# Patient Record
Sex: Male | Born: 1970 | Race: Black or African American | Hispanic: No | Marital: Married | Smoking: Former smoker
Health system: Southern US, Community
[De-identification: ages and names within clinical notes are randomized; demographics above are authoritative.]

## PROBLEM LIST (undated history)

## (undated) DIAGNOSIS — T7840XA Allergy, unspecified, initial encounter: Secondary | ICD-10-CM

## (undated) DIAGNOSIS — I10 Essential (primary) hypertension: Secondary | ICD-10-CM

## (undated) DIAGNOSIS — M1990 Unspecified osteoarthritis, unspecified site: Secondary | ICD-10-CM

## (undated) DIAGNOSIS — K219 Gastro-esophageal reflux disease without esophagitis: Secondary | ICD-10-CM

## (undated) DIAGNOSIS — G709 Myoneural disorder, unspecified: Secondary | ICD-10-CM

## (undated) DIAGNOSIS — G4730 Sleep apnea, unspecified: Secondary | ICD-10-CM

## (undated) HISTORY — DX: Unspecified osteoarthritis, unspecified site: M19.90

## (undated) HISTORY — DX: Myoneural disorder, unspecified: G70.9

## (undated) HISTORY — PX: BACK SURGERY: SHX140

## (undated) HISTORY — DX: Gastro-esophageal reflux disease without esophagitis: K21.9

## (undated) HISTORY — DX: Allergy, unspecified, initial encounter: T78.40XA

## (undated) HISTORY — DX: Sleep apnea, unspecified: G47.30

---

## 2005-06-09 HISTORY — PX: BACK SURGERY: SHX140

## 2005-06-27 ENCOUNTER — Encounter: Admission: RE | Admit: 2005-06-27 | Discharge: 2005-06-27 | Payer: Self-pay | Admitting: Orthopedic Surgery

## 2005-07-06 ENCOUNTER — Encounter: Admission: RE | Admit: 2005-07-06 | Discharge: 2005-07-06 | Payer: Self-pay | Admitting: Orthopedic Surgery

## 2006-03-20 ENCOUNTER — Ambulatory Visit (HOSPITAL_COMMUNITY): Admission: RE | Admit: 2006-03-20 | Discharge: 2006-03-21 | Payer: Self-pay | Admitting: Specialist

## 2008-07-18 ENCOUNTER — Ambulatory Visit: Payer: Self-pay | Admitting: Family Medicine

## 2008-07-18 DIAGNOSIS — R519 Headache, unspecified: Secondary | ICD-10-CM | POA: Insufficient documentation

## 2008-07-18 DIAGNOSIS — R51 Headache: Secondary | ICD-10-CM

## 2008-07-18 DIAGNOSIS — I10 Essential (primary) hypertension: Secondary | ICD-10-CM | POA: Insufficient documentation

## 2008-07-18 LAB — CONVERTED CEMR LAB
Basophils Absolute: 0 10*3/uL (ref 0.0–0.1)
CO2: 23 meq/L (ref 19–32)
Chloride: 104 meq/L (ref 96–112)
HDL: 24 mg/dL — ABNORMAL LOW (ref >39)
Hemoglobin: 13.8 g/dL (ref 13.0–17.0)
LDL Cholesterol: 113 mg/dL — ABNORMAL HIGH (ref 0–99)
Lymphocytes Relative: 43 % (ref 12–46)
Neutro Abs: 4.3 10*3/uL (ref 1.7–7.7)
Platelets: 331 10*3/uL (ref 150–400)
RDW: 14.1 % (ref 11.5–15.5)
Sodium: 142 meq/L (ref 135–145)
Total CHOL/HDL Ratio: 7.3

## 2008-08-29 ENCOUNTER — Ambulatory Visit: Payer: Self-pay | Admitting: Family Medicine

## 2010-02-27 ENCOUNTER — Ambulatory Visit: Payer: Self-pay | Admitting: Family Medicine

## 2010-02-27 ENCOUNTER — Ambulatory Visit (HOSPITAL_COMMUNITY): Admission: RE | Admit: 2010-02-27 | Discharge: 2010-02-27 | Payer: Self-pay | Admitting: Family Medicine

## 2010-02-27 DIAGNOSIS — E669 Obesity, unspecified: Secondary | ICD-10-CM

## 2010-02-27 DIAGNOSIS — R5381 Other malaise: Secondary | ICD-10-CM

## 2010-02-27 DIAGNOSIS — R350 Frequency of micturition: Secondary | ICD-10-CM

## 2010-02-27 DIAGNOSIS — R5383 Other fatigue: Secondary | ICD-10-CM

## 2010-02-27 DIAGNOSIS — M79609 Pain in unspecified limb: Secondary | ICD-10-CM | POA: Insufficient documentation

## 2010-02-27 DIAGNOSIS — R05 Cough: Secondary | ICD-10-CM

## 2010-02-28 ENCOUNTER — Encounter: Payer: Self-pay | Admitting: Family Medicine

## 2010-03-01 LAB — CONVERTED CEMR LAB
Basophils Absolute: 0.1 10*3/uL (ref 0.0–0.1)
Basophils Relative: 1 % (ref 0–1)
CO2: 25 meq/L (ref 19–32)
Calcium: 9.4 mg/dL (ref 8.4–10.5)
Eosinophils Relative: 2 % (ref 0–5)
HCT: 42.7 % (ref 39.0–52.0)
HDL: 26 mg/dL — ABNORMAL LOW (ref >39)
Lymphocytes Relative: 42 % (ref 12–46)
Neutro Abs: 4.2 10*3/uL (ref 1.7–7.7)
Platelets: 274 10*3/uL (ref 150–400)
RDW: 13.6 % (ref 11.5–15.5)
Sodium: 140 meq/L (ref 135–145)
TSH: 1.029 microintl units/mL (ref 0.350–4.500)
Total CHOL/HDL Ratio: 7.1
Triglycerides: 409 mg/dL — ABNORMAL HIGH (ref <150)

## 2010-03-02 DIAGNOSIS — J309 Allergic rhinitis, unspecified: Secondary | ICD-10-CM | POA: Insufficient documentation

## 2010-07-07 LAB — CONVERTED CEMR LAB
Glucose, Urine, Semiquant: NEGATIVE
WBC Urine, dipstick: NEGATIVE
pH: 5.5

## 2010-07-09 NOTE — Assessment & Plan Note (Signed)
Summary: office visit   Vital Signs:  Patient profile:   40 year old male Height:      71 inches Weight:      224 pounds BMI:     31.35 O2 Sat:      95 % Pulse rate:   87 / minute Pulse rhythm:   regular Resp:     16 per minute BP sitting:   148 / 105  (left arm) Cuff size:   large  Vitals Entered By: Everitt Amber LPN (February 27, 2010 1:32 PM)  Nutrition Counseling: Patient's BMI is greater than 25 and therefore counseled on weight management options. CC: Patient has been urinating alot and when he goes sometimes there is only a few drops that comes out and when he leaves he feels like he has to go again. Wife wants psa and rectal exam done also. He called her while I was in the room and I spoke with her. Needs something for sinuses also, constantly has to blow his nose    CC:  Patient has been urinating alot and when he goes sometimes there is only a few drops that comes out and when he leaves he feels like he has to go again. Wife wants psa and rectal exam done also. He called her while I was in the room and I spoke with her. Needs something for sinuses also and constantly has to blow his nose .  History of Present Illness: Reports  that the has beenn fairly well. He does have several health concerns however. He reports nocturia over the last several months, sometimes with poor stream, and he notes a recent 25 pound weight loss since he has returned to work He does have DM in his fami;ly. he also reports reduced ability to maintain an erection in recent times, and states that at times hisurinary stream is poor.  Denies recent fever or chills. Denies sinus pressure, nasal congestion , ear pain or sore throat. Denies chest congestion, or cough productive of sputum. Occasional chest pain,denies  palpitations, PND, orthopnea or leg swelling. He has been out of his bP meds for the past 1 month. He quit smoking a few months ago. Denies abdominal pain, nausea, vomitting, diarrhea or  constipation. Denies change in bowel movements . Marland Kitchen Denies  joint pain, swelling, or reduced mobility. Uncontrolled throbbing headaches sometimes with nausea and photphobia, did well when he was on migraine meds.Denies vertigo or seizures. Denies depression, anxiety or insomnia. Denies  rash, lesions, or itch.     Current Medications (verified): 1)  Maxzide-25 37.5-25 Mg Tabs (Triamterene-Hctz) .... One Tab By Mouth Once Daily  Allergies (verified): No Known Drug Allergies  Past History:  Past Medical History: Headaches since childhood, worse in the past 3 months, migraine Hands swollen and weak x 6 years. HTN diagnosed 2009 poor urinary stream and nocturia x 24yrs  Social History: Employed, factory. Married quit nicotine 11/2009 no alcohol use One son  Review of Systems      See HPI General:  Complains of fatigue. Eyes:  Denies blurring, discharge, eye pain, and red eye. ENT:  Complains of sinus pressure; frontal mainly n the Summer. GI:  red stool intermittently x 4months, with hic and crystal light. GU:  Complains of erectile dysfunction, nocturia, urinary frequency, and urinary hesitancy; poor urinary stream, at least 4 episodes of urination at night occuring in the past 2 years, symptoms are only at night reportedly, erctions  not as long as they used to be  but able to have an erection. MS:  left lower ext pain and tingling  from hip to ankle , lateral aspect x 2 weeks. Neuro:  Complains of headaches; bitemporal pounding headachesx 3 yrs, photophobia, sleep offers relief.. Endo:  Complains of excessive urination; denies excessive hunger, excessive thirst, and heat intolerance. Heme:  Denies abnormal bruising and bleeding. Allergy:  Complains of seasonal allergies; Summer.  Physical Exam  General:  Well-developed,well-nourished,in no acute distress; alert,appropriate and cooperative throughout examination HEENT: No facial asymmetry,  EOMI, No sinus tenderness, TM's  Clear, oropharynx  pink and moist.   Chest: Clear to auscultation bilaterally.  CVS: S1, S2, No murmurs, No S3.   Abd: Soft, Nontender.  MS: Adequate ROM spine, hips, shoulders and knees.  Ext: No edema.   CNS: CN 2-12 intact, power tone and sensation normal throughout.   Skin: Intact, no visible lesions or rashes.  Psych: Good eye contact, normal affect.  Memory intact, not anxious or depressed appearing. Rectal; prostate non tender, guaic negative stool   Impression & Recommendations:  Problem # 1:  LEG PAIN, LEFT (ICD-729.5) Assessment Deteriorated  Orders: Depo- Medrol 80mg  (J1040) Ketorolac-Toradol 15mg  (O1308) Admin of Therapeutic Inj  intramuscular or subcutaneous (65784)  Problem # 2:  OBESITY (ICD-278.00) Assessment: Comment Only  Orders: T- Hemoglobin A1C (69629-52841)  Ht: 71 (02/27/2010)   Wt: 224 (02/27/2010)   BMI: 31.35 (02/27/2010) therapeutic lifestyle change discussed and encouraged  Problem # 3:  COUGH (ICD-786.2) Assessment: Comment Only  Orders: CXR- 2view (CXR)  Problem # 4:  URINARY FREQUENCY (ICD-788.41) Assessment: Deteriorated  Orders: Urology Referral (Urology) UA Dipstick W/ Micro (manual) (32440), negative  Problem # 5:  HYPERTENSION (ICD-401.9) Assessment: Deteriorated  The following medications were removed from the medication list:    Maxzide-25 37.5-25 Mg Tabs (Triamterene-hctz) ..... One tab by mouth once daily His updated medication list for this problem includes:    Maxzide-25 37.5-25 Mg Tabs (Triamterene-hctz) .Marland Kitchen... Take 1 tablet by mouth once a day Check your Blood Pressure regularly. If it is above150/95  you should make an appointment.  Orders: EKG w/ Interpretation (93000) T-Basic Metabolic Panel (10272-53664)  BP today: 148/105 Prior BP: 110/70 (08/29/2008)  Labs Reviewed: K+: 3.7 (07/18/2008) Creat: : 0.96 (07/18/2008)   Chol: 174 (07/18/2008)   HDL: 24 (07/18/2008)   LDL: 113 (07/18/2008)   TG: 187  (07/18/2008)  Problem # 6:  HEADACHE (ICD-784.0) Assessment: Deteriorated  The following medications were removed from the medication list:    Ibuprofen 800 Mg Tabs (Ibuprofen) ..... One tab by mouth two times a day as needed for severe headache His updated medication list for this problem includes:    Ibuprofen 800 Mg Tabs (Ibuprofen) .Marland Kitchen... Take 1 tablet by mouth two times a day as needed for severe headache topamax 25mg  twice daily  Problem # 7:  ALLERGIC RHINITIS, SEASONAL (ICD-477.0) Assessment: Comment Only loratidine prescribed  Complete Medication List: 1)  Maxzide-25 37.5-25 Mg Tabs (Triamterene-hctz) .... Take 1 tablet by mouth once a day 2)  Topiramate 25 Mg Tabs (Topiramate) .... Take 1 tablet by mouth two times a day 3)  Ibuprofen 800 Mg Tabs (Ibuprofen) .... Take 1 tablet by mouth two times a day as needed for severe headache 4)  Prednisone (pak) 5 Mg Tabs (Prednisone) .... Use as directed 5)  Claritin 10 Mg Tabs (Loratadine) .... Take 1 tablet by mouth once a day as needed for uncontrolled allergy symptoms  Other Orders: T-Lipid Profile (40347-42595) T-CBC w/Diff (63875-64332) T-TSH [phone]) T-PSA (  [phone]) Tdap => 80yrs IM [phone]) Admin 1st Vaccine (24401) Admin 1st Vaccine [facility]) [phone])  Patient Instructions: 1)  Please schedule a follow-up appointment in 2 months. 2)  It is important that you exercise regularly at least 20 minutes 5 times a week. If you develop chest pain, have severe difficulty breathing, or feel very tired , stop exercising immediately and seek medical attention. 3)  You need to lose weight. Consider a lower calorie diet and regular exercise.  4)  Congrats on quitting smoking! 5)  You need to resume your blood pressure medication and also eat less salt, and increased vegetables and fruit. 6)  Med will be sent in for the left leg pain and youi will also get injections, if no better in 2 weeks , pls call and lv a msg. 7)  No blood was  in your stool when checked today. pls keep an eye on this, and if you see red stool , you need to put some on the card we will giveyou and bring it in . 8)  Meds are sent ni for allergies a;lso. 9)  Check your Blood Pressure regularly. If it is above150/95  you should make an appointment. 10)  BMP prior to visit, ICD-9: 11)  Lipid Panel prior to visit, ICD-9: 12)  TSH prior to visit, ICD-9: 13)  CBC w/ Diff prior to visit, ICD-9: 14)  PSA prior to visit, ICD-9: 15)  HbgA1C prior to visit, ICD-9: 16)  Med sent in for migraine headaches, pl s take as directed 17)  You are being referred to a urologist about your urinary probs. Prescriptions: CLARITIN 10 MG TABS (LORATADINE) Take 1 tablet by mouth once a day as needed for uncontrolled allergy symptoms  #90 x 0   Entered and Authorized by:   Syliva Overman MD   Signed by:   Syliva Overman MD on 02/27/2010   Method used:   Printed then faxed to ...         RxID:   [id number] PREDNISONE (PAK) 5 MG TABS (PREDNISONE) Use as directed  #21 x 0   Entered and Authorized by:   Syliva Overman MD   Signed by:   Syliva Overman MD on 02/27/2010   Method used:   Printed then faxed to ...         RxID:   [id number] IBUPROFEN 800 MG TABS (IBUPROFEN) Take 1 tablet by mouth two times a day as needed for severe headache  #40 x 1   Entered and Authorized by:   Syliva Overman MD   Signed by:   Syliva Overman MD on 02/27/2010   Method used:   Printed then faxed to ...         RxID:   [id number] TOPIRAMATE 25 MG TABS (TOPIRAMATE) Take 1 tablet by mouth two times a day  #180 x 1   Entered and Authorized by:   Syliva Overman MD   Signed by:   Syliva Overman MD on 02/27/2010   Method used:   Printed then faxed to ...         RxID:   [id number] MAXZIDE-25 37.5-25 MG TABS (TRIAMTERENE-HCTZ) Take 1 tablet by mouth once a day  #90 x 1   Entered and Authorized by:   Syliva Overman MD   Signed by:   Syliva Overman MD on  02/27/2010   Method used:   Printed then faxed to ...         RxID:   [id number]  Medication Administration  Injection # 1:    Medication: Depo- Medrol 80mg     Diagnosis: LEG PAIN, LEFT (ICD-729.5)    Route: IM    Site: RUOQ gluteus    Exp Date: 10/2010    Lot #: [id number]    Mfr: Pharmacia    Comments: 80mg  given     Patient tolerated injection without complications    Given by: Everitt Amber LPN (February 27, 2010 3:33 PM)  Injection # 2:    Medication: Ketorolac-Toradol 15mg     Diagnosis: LEG PAIN, LEFT (ICD-729.5)    Route: IM    Site: RUOQ gluteus    Exp Date: 08/2011    Lot #: [id number]     Mfr: [email or web address]    Comments: 60mg  given     Patient tolerated injection without complications    Given by: Everitt Amber LPN (February 27, 2010 3:33 PM)  Orders Added: 1)  CXR- 2view [CXR] 2)  Est. Patient Level IV [45409] 3)  EKG w/ Interpretation [93000] 4)  T-Basic Metabolic Panel [80048-22910] 5)  T-Lipid Profile [80061-22930] 6)  T-CBC w/Diff [81191-47829] 7)  T-TSH [56213-08657] 8)  T-PSA [84696-29528] 9)  T- Hemoglobin A1C [83036-23375] 10)  Urology Referral [Urology] 11)  Depo- Medrol 80mg  [J1040] 12)  Ketorolac-Toradol 15mg  [J1885] 13)  Admin of Therapeutic Inj  intramuscular or subcutaneous [96372] 14)  Tdap => 9yrs IM [90715] 15)  Admin 1st Vaccine [90471] 16)  Admin 1st Vaccine (State) [41324M] 17)  UA Dipstick W/ Micro (manual) [81000]      Tetanus/Td Vaccine    Vaccine Type: Tdap    Site: left deltoid    Mfr: boostrix    Dose: 0.5 ml    Route: IM    Given by: Everitt Amber LPN    Exp. Date: 10/2010    Lot #: [id number]    Laboratory Results   Urine Tests    Routine Urinalysis   Color: yellow Appearance: Clear Glucose: negative   (Normal Range: Negative) Bilirubin: negative   (Normal Range: Negative) Ketone: negative   (Normal Range: Negative) Spec. Gravity: 1.020   (Normal Range: 1.003-1.035) Blood: negative   (Normal Range:  Negative) pH: 5.5   (Normal Range: 5.0-8.0) Protein: negative   (Normal Range: Negative) Urobilinogen: 0.2   (Normal Range: 0-1) Nitrite: negative   (Normal Range: Negative) Leukocyte Esterace: negative   (Normal Range: Negative)

## 2010-10-11 ENCOUNTER — Other Ambulatory Visit: Payer: Self-pay | Admitting: Family Medicine

## 2010-10-11 ENCOUNTER — Other Ambulatory Visit: Payer: Self-pay

## 2010-10-11 MED ORDER — TRIAMTERENE-HCTZ 37.5-25 MG PO TABS: 1.0000 | ORAL_TABLET | Freq: Every day | ORAL | Status: DC

## 2010-10-25 NOTE — Op Note (Signed)
NAMEAMADU, SCHLAGETER               ACCOUNT NO.:  [id number]   MEDICAL RECORD NO.:  [id number]          PATIENT TYPE:  OIB   LOCATION:  2550                         FACILITY:  [facility]   PHYSICIAN:  Kerrin Champagne, M.D.   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   The patient was found to have hypertrophic changes involving the right L5-S1  facette causing lateral recess stenosis between it and a protruding disk at  the L5-S1 level posterolaterally affecting primarily the S1 nerve root.   PROCEDURE:  Right L5-S1 lateral recess decompression, decompression of the  S1 nerve root, right-sided L5-S1 microdiskectomy.   SURGEON:  Kerrin Champagne, M.D.   ASSISTANT:  Maud Deed, [facility].   ANESTHESIA:  General via orotracheal intubation, Dr. Diamantina Monks and Dr.  Jean Rosenthal.   ESTIMATED BLOOD LOSS:  Less than 25 mL.   DRAINS:  None.   COMPLICATIONS:  None.  The patient returned to the PACU in good condition.   HISTORY OF PRESENT ILLNESS:  This patient is a 40 year old male who has a  history of previous disk injury that occurred earlier this year, treated  initially with epidural steroids x3 with significant improvement.  He has  recently had recurrence of pain with standing, ambulation, pain with  sitting, just about any position.  Severe posterior thigh and calf in S1  distribution. Clinical exam shows diminished ankle jerk, weakness in plantar  flexion, positive straight leg raise on the right. Repeat MRI study  demonstrates disk protrusion that is concentric towards the right side with  the S1 nerve root entrapped between disk material and hypertrophic facette  joint on the right side at L5-S1.  The patient was brought to the operating  room to undergo lateral recess decompression with excision of HNP L5-S1  right side.  Intraoperative findings as above.   DESCRIPTION OF PROCEDURE:  After adequate general anesthesia the patient  transferred to the [facility] table knee-chest position.  Standard preoperative  antibiotics.  All pressure points well-padded.  The patient had a standard  prep with DuraPrep solution from the lower dorsal spine to the mid sacral  level.  Draped in the usual manner. Iodine Vi-drape was used.   Spinal needles were placed at the expected L5-S1 level and intraoperative  lateral radiograph demonstrated needles on either side of the disk space  over the posterior aspect of the laminas of L5-S1.  Incision was made  initially about 1-1/2 to 2 inches in length through skin and subcutaneous  layers after infiltration of Marcaine 0.5% with 1:200,000 epinephrine.  Incision carried sharply down to the lumbodorsal fascia and this was incised  on the right side of the expected spinous process of L5 and of S1.  Cobb was  used to elevate the paralumbar muscles on the right side off posterior  aspect of  the lamina of L5 and of S1 and over the lateral aspect of the  spinous process of L5.  Boss McCullough retractor was placed. A Penfield 4  placed within the facette on the right side at the L5-S1 level.  Intraoperative radiograph demonstrated the Penfield 4 within the facette  joint.  A Leksell rongeur then used to remove a small portion the inferior  aspect of the lamina of L5 on the right side.  3 mm Kerrison used to further  debride portion of the small portion of the inferior aspect lamina on the  right side at L5 up to near the insertion of the ligamentum flavum.  The  superficial portion of flavum debrided using 3 mm Kerrisons.  The ligamentum  flavum then resected off the superior aspect of the lamina of S1 medially  and then extended laterally. Foraminotomy performed over the S1 nerve root  on the right  side. The pedicle of S1 identified and medial facette quite  hypertrophic.  This was resected lateral to the area of impingement on the  S1 nerve root along the border of the medial aspect of pedicle of S1 in  order to decompress the lateral recess at the L5-S1 level.  This was done up  superiorly to the insertion of ligamentum flavum was encountered and this  was resected off of the ventral surface of the inferior portion of the L5  lamina.  This completed lateral recess decompression. The S1 nerve root was  easily identified, it was erythematous and swollen, well decompressed.  A  D'Errico retractor then used to carefully retract the S1 nerve root,  palpating the posterior aspect of disk, ventral ridge noted over the  posterior aspect of the S1 vertebral body superiorly, disk protrusion over  the dorsal aspect the S1 disk space from the central to the right side  noted.  The operating room microscope draped and brought in the field.  Initially loupe magnification headlamp was used, but the microscope was used  for the resection of the facette and for the retraction of the S1 nerve root  and identification of the disk space and diskectomy that were then performed  making cruciate incision with a 15 blade scalpel, freeing up the disk space  and material using an Epstein curette and removing using micro pituitary and  pituitaries with teeth, debriding the disk loose degenerative disk material  as much as possible was removed.  Care taken not to damage the endplates.  With this then the posterior aspect of disk was felt to be well decompressed  without any signs of protrusion within the spinal canal.  The S1 nerve root  along the lateral recess and the medial aspect of pedicle of S1, exiting the  S1 foramen without difficulty.  Gelfoam thrombin-soaked was applied to the  posterior aspect of the disk and within the spinal canal ventral to the nerve root for hemostasis purposes.  Bone wax  applied to the bleeding  cancellous bone surfaces.  Excess bone wax removed.  Gelfoam removed.  There  was no active bleeding present.  Soft tissues allowed to fall back into  place. The lumbodorsal fascia approximated with interrupted 0 Vicryl sutures  using a UR6 needle.  Deep subcu layers approximated with interrupted 0  Vicryl sutures with UR6 needle.  Subcu layers approximated with interrupted  2-0 Vicryl sutures and skin closed with a running subcu stitch of 4-0 Vicryl  after application of the subcu suture then Dermabond was used to close  the  skin.  4x4s fixed to the skin with Hypafix tape.  The patient then returned  to the supine position, reactivated, extubated, returned to the recovery  room in satisfactory condition.  All instrument and sponge counts were  correct.      Kerrin Champagne, M.D.  Electronically Signed     JEN/MEDQ  D:  03/20/2006  T:  03/23/2006  Job:  [id number]

## 2010-12-04 ENCOUNTER — Encounter: Payer: Self-pay | Admitting: Family Medicine

## 2010-12-05 ENCOUNTER — Ambulatory Visit: Payer: Self-pay | Admitting: Family Medicine

## 2011-03-10 ENCOUNTER — Other Ambulatory Visit: Payer: Self-pay | Admitting: Family Medicine

## 2011-03-27 ENCOUNTER — Encounter: Payer: Self-pay | Admitting: Family Medicine

## 2011-04-01 ENCOUNTER — Ambulatory Visit (INDEPENDENT_AMBULATORY_CARE_PROVIDER_SITE_OTHER): Payer: 59 | Admitting: Family Medicine

## 2011-04-01 ENCOUNTER — Encounter: Payer: Self-pay | Admitting: Family Medicine

## 2011-04-01 VITALS — BP 122/82 | HR 85 | Resp 16 | Ht 71.0 in | Wt 207.8 lb

## 2011-04-01 DIAGNOSIS — R350 Frequency of micturition: Secondary | ICD-10-CM

## 2011-04-01 DIAGNOSIS — I10 Essential (primary) hypertension: Secondary | ICD-10-CM

## 2011-04-01 DIAGNOSIS — R5383 Other fatigue: Secondary | ICD-10-CM

## 2011-04-01 DIAGNOSIS — E669 Obesity, unspecified: Secondary | ICD-10-CM

## 2011-04-01 DIAGNOSIS — R5381 Other malaise: Secondary | ICD-10-CM

## 2011-04-01 DIAGNOSIS — J301 Allergic rhinitis due to pollen: Secondary | ICD-10-CM

## 2011-04-01 DIAGNOSIS — R7301 Impaired fasting glucose: Secondary | ICD-10-CM

## 2011-04-01 DIAGNOSIS — Z13220 Encounter for screening for lipoid disorders: Secondary | ICD-10-CM

## 2011-04-01 DIAGNOSIS — F17200 Nicotine dependence, unspecified, uncomplicated: Secondary | ICD-10-CM

## 2011-04-01 DIAGNOSIS — Z125 Encounter for screening for malignant neoplasm of prostate: Secondary | ICD-10-CM

## 2011-04-01 MED ORDER — FLUTICASONE PROPIONATE 50 MCG/ACT NA SUSP: NASAL | Status: DC

## 2011-04-01 MED ORDER — AMLODIPINE BESYLATE 5 MG PO TABS: 5.0000 mg | ORAL_TABLET | Freq: Every day | ORAL | Status: DC

## 2011-04-01 MED ORDER — PREDNISONE (PAK) 5 MG PO TABS: 5.0000 mg | ORAL_TABLET | ORAL | Status: DC

## 2011-04-01 NOTE — Patient Instructions (Addendum)
CPE in 6 weeks.  Stop triamterene, and change to amlodipine 5 mg one daily for blood pressure starting tomorrow. Hopefully you will urinate less frequently.  Pls stop sodas, drink mainly water and before 6 in the evening.  Spray for allergies and prednisone sent in.  Fasting labs this week Saturday , nothing for 12 hours  It is important that you exercise regularly at least 30 minutes 5 times a week. If you develop chest pain, have severe difficulty breathing, or feel very tired, stop exercising immediately and seek medical attention  A healthy diet is rich in fruit, vegetables and whole grains. Poultry fish, nuts and beans are a healthy choice for protein rather then red meat. A low sodium diet and drinking 64 ounces of water daily is generally recommended. Oils and sweet should be limited. Carbohydrates especially for those who are diabetic or overweight, should be limited to 3-45 gram per meal. It is important to eat on a regular schedule, at least 3 times daily. Snacks should be primarily fruits, vegetables or nuts. Goal weight is 190 pounds  Please think about quitting smoking.  This is very important for your health.  Consider setting a quit date, then cutting back or switching brands to prepare to stop.  Also think of the money you will save every day by not smoking.  Quick Tips to Quit Smoking: Fix a date i.e. keep a date in mind from when you would not touch a tobacco product to smoke  Keep yourself busy and block your mind with work loads or reading books or watching movies in malls where smoking is not allowed  Vanish off the things which reminds you about smoking for example match box, or your favorite lighter, or the pipe you used for smoking, or your favorite jeans and shirt with which you used to enjoy smoking, or the club where you used to do smoking  Try to avoid certain people places and incidences where and with whom smoking is a common factor to add on  Praise yourself with  some token gifts from the money you saved by stopping smoking  Anti Smoking teams are there to help you. Join their programs  Anti-smoking Gums are there in many medical shops. Try them to quit smoking   Side-effects of Smoking: Disease caused by smoking cigarettes are emphysema, bronchitis, heart failures  Premature death  Cancer is the major side effect of smoking  Heart attacks and strokes are the quick effects of smoking causing sudden death  Some smokers lives end up with limbs amputated  Breathing problem or fast breathing is another side effect of smoking  Due to more intakes of smokes, carbon mono-oxide goes into your brain and other muscles of the body which leads to swelling of the veins and blockage to the air passage to lungs  Carbon monoxide blocks blood vessels which leads to blockage in the flow of blood to different major body organs like heart lungs and thus leads to attacks and deaths  During pregnancy smoking is very harmful and leads to premature birth of the infant, spontaneous abortions, low weight of the infant during birth  Fat depositions to narrow and blocked blood vessels causing heart attacks  In many cases cigarette smoking caused infertility in men

## 2011-04-05 LAB — HEMOGLOBIN A1C: Mean Plasma Glucose: 123 mg/dL — ABNORMAL HIGH (ref <117)

## 2011-04-05 LAB — CBC WITH DIFFERENTIAL/PLATELET
Basophils Relative: 0 % (ref 0–1)
Eosinophils Absolute: 0.2 10*3/uL (ref 0.0–0.7)
Eosinophils Relative: 3 % (ref 0–5)
MCH: 30.8 pg (ref 26.0–34.0)
MCHC: 34 g/dL (ref 30.0–36.0)
MCV: 90.4 fL (ref 78.0–100.0)
Neutrophils Relative %: 55 % (ref 43–77)
Platelets: 294 10*3/uL (ref 150–400)

## 2011-04-05 LAB — PSA: PSA: 0.38 ng/mL (ref <=4.00)

## 2011-04-05 LAB — BASIC METABOLIC PANEL
BUN: 12 mg/dL (ref 6–23)
Chloride: 101 mEq/L (ref 96–112)
Potassium: 4.2 mEq/L (ref 3.5–5.3)
Sodium: 139 mEq/L (ref 135–145)

## 2011-04-05 LAB — LIPID PANEL
Cholesterol: 204 mg/dL — ABNORMAL HIGH (ref 0–200)
HDL: 28 mg/dL — ABNORMAL LOW (ref >39)
Total CHOL/HDL Ratio: 7.3 Ratio
Triglycerides: 298 mg/dL — ABNORMAL HIGH (ref <150)
VLDL: 60 mg/dL — ABNORMAL HIGH (ref 0–40)

## 2011-04-05 LAB — TSH: TSH: 1.197 u[IU]/mL (ref 0.350–4.500)

## 2011-04-14 ENCOUNTER — Telehealth: Payer: Self-pay | Admitting: Family Medicine

## 2011-04-17 NOTE — Telephone Encounter (Signed)
Message left on cell # provided by spouse [phone], stating blood sugar has improved, and cholesterol need to be improved

## 2011-04-29 DIAGNOSIS — F17200 Nicotine dependence, unspecified, uncomplicated: Secondary | ICD-10-CM | POA: Insufficient documentation

## 2011-04-29 NOTE — Progress Notes (Signed)
  Subjective:    Patient ID: Evan Black, male    DOB: 07/31/1970, 40 y.o.   MRN: [id number]  HPI The PT is here for follow up and re-evaluation of chronic medical conditions, medication management and review of any available recent lab and radiology data.  Preventive health is updated, specifically  Cancer screening and Immunization.   Questions or concerns regarding consultations or procedures which the PT has had in the interim are  addressed. The PT denies any adverse reactions to current medications since the last visit.  C/o persistent urinary frequency espescialy at night , reports good stream , denies dysuria.  C/o increased allergy symptoms with nasal congestion , which is clear and dry cough. Still smokes not willing to set quit date , but acknowledges the need to quit      Review of Systems See HPI Denies recent fever or chills.  Denies chest congestion, productive cough or wheezing. Denies chest pains, palpitations and leg swelling Denies abdominal pain, nausea, vomiting,diarrhea or constipation.   Denies dysuria, frequency, hesitancy or incontinence. Denies joint pain, swelling and limitation in mobility. Denies headaches, seizures, numbness, or tingling. Denies depression, anxiety or insomnia. Denies skin break down or rash.        Objective:   Physical Exam Patient alert and oriented and in no cardiopulmonary distress.  HEENT: No facial asymmetry, EOMI, no sinus tenderness,  oropharynx pink and moist.  Neck supple no adenopathy.  Chest: Clear to auscultation bilaterally.Decreased air entry bilaterally  CVS: S1, S2 no murmurs, no S3.  ABD: Soft non tender. Bowel sounds normal.  Ext: No edema  MS: Adequate ROM spine, shoulders, hips and knees.  Skin: Intact, no ulcerations or rash noted.  Psych: Good eye contact, normal affect. Memory intact not anxious or depressed appearing.  CNS: CN 2-12 intact, power, tone and sensation normal  throughout.        Assessment & Plan:

## 2011-04-29 NOTE — Assessment & Plan Note (Signed)
Improved. Pt applauded on succesful weight loss through lifestyle change, and encouraged to continue same. Weight loss goal set for the next several months.  

## 2011-04-29 NOTE — Assessment & Plan Note (Signed)
Continued frequency, will change BP med top see if this hjas an effect, will need urology eval if persists

## 2011-04-29 NOTE — Assessment & Plan Note (Signed)
Cessation counseling done 

## 2011-04-29 NOTE — Assessment & Plan Note (Signed)
Increased and uncontrolled symptoms with dry cough med prescribed

## 2011-04-29 NOTE — Assessment & Plan Note (Signed)
Controlled however experiencing a lot of urinary frequency will change med to see if this has an effect

## 2012-03-08 ENCOUNTER — Ambulatory Visit (INDEPENDENT_AMBULATORY_CARE_PROVIDER_SITE_OTHER): Payer: 59 | Admitting: Family Medicine

## 2012-03-08 ENCOUNTER — Encounter: Payer: Self-pay | Admitting: Family Medicine

## 2012-03-08 VITALS — BP 120/80 | HR 77 | Resp 18 | Ht 71.0 in | Wt 213.1 lb

## 2012-03-08 DIAGNOSIS — I10 Essential (primary) hypertension: Secondary | ICD-10-CM

## 2012-03-08 DIAGNOSIS — F17200 Nicotine dependence, unspecified, uncomplicated: Secondary | ICD-10-CM

## 2012-03-08 DIAGNOSIS — R7309 Other abnormal glucose: Secondary | ICD-10-CM

## 2012-03-08 DIAGNOSIS — E785 Hyperlipidemia, unspecified: Secondary | ICD-10-CM

## 2012-03-08 DIAGNOSIS — Z125 Encounter for screening for malignant neoplasm of prostate: Secondary | ICD-10-CM

## 2012-03-08 DIAGNOSIS — R7303 Prediabetes: Secondary | ICD-10-CM

## 2012-03-08 DIAGNOSIS — E669 Obesity, unspecified: Secondary | ICD-10-CM

## 2012-03-08 DIAGNOSIS — R5381 Other malaise: Secondary | ICD-10-CM

## 2012-03-08 DIAGNOSIS — Z13220 Encounter for screening for lipoid disorders: Secondary | ICD-10-CM

## 2012-03-08 NOTE — Patient Instructions (Addendum)
CPE in December  Blood pressure is good.no med change  Fasting labs end November for December visit, cbc, fasting chem 7, lipid, hepatic, hBA1C, TSH and PSA  You need to stop smoking to reduce your risk of heart and lung disease and cancer   Please work on 6 pound weight loss in the next 2 month

## 2012-03-08 NOTE — Progress Notes (Signed)
  Subjective:    Patient ID: Evan Black, male    DOB: [birth year], 41 y.o.   MRN: [id number]  HPI The PT is here for follow up and re-evaluation of chronic medical conditions, medication management and review of any available recent lab and radiology data.  Preventive health is updated, specifically  Cancer screening and Immunization.   Questions or concerns regarding consultations or procedures which the PT has had in the interim are  addressed. The PT denies any adverse reactions to current medications since the last visit.  There are no new concerns, except that he has gained weight, is worried about further weight gain , espescially  There are no specific complaints       Review of Systems See HPI Denies recent fever or chills. Denies sinus pressure, nasal congestion, ear pain or sore throat. Denies chest congestion, productive cough or wheezing. Denies chest pains, palpitations and leg swelling Denies abdominal pain, nausea, vomiting,diarrhea or constipation.   Denies dysuria, frequency, hesitancy or incontinence. Denies joint pain, swelling and limitation in mobility. Denies headaches, seizures, numbness, or tingling. Denies skin break down or rash.        Objective:   Physical Exam Patient alert and oriented and in no cardiopulmonary distress.  HEENT: No facial asymmetry, EOMI, no sinus tenderness,  oropharynx pink and moist.  Neck supple no adenopathy.  Chest: Clear to auscultation bilaterally.  CVS: S1, S2 no murmurs, no S3.  ABD: Soft non tender. Bowel sounds normal.  Ext: No edema  MS: Adequate ROM spine, shoulders, hips and knees.  Skin: Intact, no ulcerations or rash noted.  Psych: Good eye contact, normal affect. Memory intact not anxious or depressed appearing.  CNS: CN 2-12 intact, power, tone and sensation normal throughout.        Assessment & Plan:

## 2012-03-14 DIAGNOSIS — E785 Hyperlipidemia, unspecified: Secondary | ICD-10-CM | POA: Insufficient documentation

## 2012-03-14 DIAGNOSIS — R7303 Prediabetes: Secondary | ICD-10-CM | POA: Insufficient documentation

## 2012-03-14 NOTE — Assessment & Plan Note (Signed)
Patient educated about the importance of limiting  Carbohydrate intake , the need to commit to daily physical activity for a minimum of 30 minutes , and to commit weight loss. The fact that changes in all these areas will reduce or eliminate all together the development of diabetes is stressed.    

## 2012-03-14 NOTE — Assessment & Plan Note (Signed)
Low fat diet discussed and encouraged. Updated lab prior to next visit

## 2012-03-14 NOTE — Assessment & Plan Note (Signed)
Unchanged. Patient counseled for approximately 5 minutes regarding the health risks of ongoing nicotine use, specifically all types of cancer, heart disease, stroke and respiratory failure. The options available for help with cessation ,the behavioral changes to assist the process, and the option to either gradully reduce usage  Or abruptly stop.is also discussed. Pt is also encouraged to set specific goals in number of cigarettes used daily, as well as to set a quit date.  

## 2012-03-14 NOTE — Assessment & Plan Note (Signed)
Controlled, no change in medication DASH diet and commitment to daily physical activity for a minimum of 30 minutes discussed and encouraged, as a part of hypertension management. The importance of attaining a healthy weight is also discussed.  

## 2012-03-14 NOTE — Assessment & Plan Note (Signed)
Deteriorated. Patient re-educated about  the importance of commitment to a  minimum of 150 minutes of exercise per week. The importance of healthy food choices with portion control discussed. Encouraged to start a food diary, count calories and to consider  joining a support group. Sample diet sheets offered. Goals set by the patient for the next several months.    

## 2012-03-18 ENCOUNTER — Telehealth: Payer: Self-pay | Admitting: Family Medicine

## 2012-03-18 ENCOUNTER — Ambulatory Visit (INDEPENDENT_AMBULATORY_CARE_PROVIDER_SITE_OTHER): Payer: 59 | Admitting: Family Medicine

## 2012-03-18 ENCOUNTER — Encounter: Payer: Self-pay | Admitting: Family Medicine

## 2012-03-18 VITALS — BP 130/80 | HR 70 | Resp 18 | Ht 71.0 in | Wt 216.0 lb

## 2012-03-18 DIAGNOSIS — M549 Dorsalgia, unspecified: Secondary | ICD-10-CM | POA: Insufficient documentation

## 2012-03-18 MED ORDER — HYDROCODONE-ACETAMINOPHEN 5-500 MG PO TABS: 1.0000 | ORAL_TABLET | Freq: Four times a day (QID) | ORAL | Status: DC | PRN

## 2012-03-18 MED ORDER — CYCLOBENZAPRINE HCL 10 MG PO TABS: 10.0000 mg | ORAL_TABLET | Freq: Every evening | ORAL | Status: DC | PRN

## 2012-03-18 MED ORDER — IBUPROFEN 600 MG PO TABS: 600.0000 mg | ORAL_TABLET | Freq: Four times a day (QID) | ORAL | Status: DC | PRN

## 2012-03-18 NOTE — Telephone Encounter (Signed)
He can come now and wait to be seen or be here right at 1 oclock

## 2012-03-18 NOTE — Assessment & Plan Note (Signed)
Acute injury per above. I will treat him conservatively with anti-inflammatories, muscle relaxant at night and hydrocodone for severe pain. He does have history of back surgery prior so he does not improve over the next 4 weeks I would image him. No red flags.

## 2012-03-18 NOTE — Telephone Encounter (Signed)
Please advise 

## 2012-03-18 NOTE — Progress Notes (Signed)
  Subjective:    Patient ID: Evan Black, male    DOB: 11-08-1970, 41 y.o.   MRN: [id number]  HPI Patient presents with acute left-sided back pain and hip pain. He was wrestling with some friends when he he pulled supplement her something pop. He has history of chronic back pain he is status post a remote low back surgery. He has been using ibuprofen and Aleve which have not helped. His pain is mostly after he states for long periods of time or as he is trying to lay back. If he is up walking or moving around his pain is improved. He denies any change in bowel or bladder. Pain radiates from his left lower back to just below his hip.   Review of Systems  GEN- denies fatigue, fever, weight loss,weakness, recent illness HEENT- denies eye drainage, change in vision, nasal discharge, CVS- denies chest pain, palpitations RESP- denies SOB, cough, wheeze ABD- denies N/V, change in stools, abd pain GU- denies dysuria, hematuria, dribbling, incontinence MSK- + joint pain, muscle aches, injury       Objective:   Physical Exam GEN- NAD, alert and oriented x3 Neck- Supple normal ROM BACK- TTP left lumbar spine, mild spasm in paraspinals, neg SLR Hip- normal IR/ER , ROM Neuro- able to squat, walk on toes, DTR symmetric bilat lower ext, strength equal bilat, sensation grossly in tact, antalgic gait EXT- No edema Pulses- Radial, DP- 2+        Assessment & Plan:

## 2012-03-18 NOTE — Telephone Encounter (Signed)
Noted and patient in for visit.

## 2012-03-18 NOTE — Patient Instructions (Signed)
Take medications as prescribed Muscle relaxant, anti-inflammatory Pain medication- Vicodin, do not take while workingBack Pain, Adult Back pain is very common. The pain often gets better over time. The cause of back pain is usually not dangerous. Most people can learn to manage their back pain on their own.   HOME CARE    Stay active. Start with short walks on flat ground if you can. Try to walk farther each day.   Do not sit, drive, or stand in one place for more than 30 minutes. Do not stay in bed.   Do not avoid exercise or work. Activity can help your back heal faster.   Be careful when you bend or lift an object. Bend at your knees, keep the object close to you, and do not twist.   Sleep on a firm mattress. Lie on your side, and bend your knees. If you lie on your back, put a pillow under your knees.   Only take medicines as told by your doctor.   Put ice on the injured area.   Put ice in a plastic bag.   Place a towel between your skin and the bag.   Leave the ice on for 15 to 20 minutes, 3 to 4 times a day for the first 2 to 3 days. After that, you can switch between ice and heat packs.   Ask your doctor about back exercises or massage.   Avoid feeling anxious or stressed. Find good ways to deal with stress, such as exercise.  GET HELP RIGHT AWAY IF:    Your pain does not go away with rest or medicine.   Your pain does not go away in 1 week.   You have new problems.   You do not feel well.   The pain spreads into your legs.   You cannot control when you poop (bowel movement) or pee (urinate).   Your arms or legs feel weak or lose feeling (numbness).   You feel sick to your stomach (nauseous) or throw up (vomit).   You have belly (abdominal) pain.   You feel like you may pass out (faint).  MAKE SURE YOU:    Understand these instructions.   Will watch your condition.   Will get help right away if you are not doing well or get worse.  Document Released:  11/12/2007 Document Revised: 08/18/2011 Document Reviewed: 10/14/2010 [facility] Patient Information 2013 [place], [place].

## 2012-06-08 ENCOUNTER — Encounter: Payer: 59 | Admitting: Family Medicine

## 2012-08-16 ENCOUNTER — Encounter (HOSPITAL_COMMUNITY): Payer: Self-pay | Admitting: *Deleted

## 2012-08-16 ENCOUNTER — Emergency Department (HOSPITAL_COMMUNITY)
Admission: EM | Admit: 2012-08-16 | Discharge: 2012-08-17 | Disposition: A | Discharge status: Home / Self Care | Payer: 59 | Attending: Emergency Medicine | Admitting: Emergency Medicine

## 2012-08-16 ENCOUNTER — Emergency Department (HOSPITAL_COMMUNITY): Payer: 59

## 2012-08-16 DIAGNOSIS — F17200 Nicotine dependence, unspecified, uncomplicated: Secondary | ICD-10-CM | POA: Insufficient documentation

## 2012-08-16 DIAGNOSIS — M79604 Pain in right leg: Secondary | ICD-10-CM

## 2012-08-16 DIAGNOSIS — M79609 Pain in unspecified limb: Secondary | ICD-10-CM | POA: Insufficient documentation

## 2012-08-16 MED ORDER — DIPHENHYDRAMINE HCL 50 MG/ML IJ SOLN
25.0000 mg | Freq: Once | INTRAMUSCULAR | Status: AC
  Administered 2012-08-16: 25 mg via INTRAVENOUS
  Filled 2012-08-16: qty 1

## 2012-08-16 MED ORDER — SODIUM CHLORIDE 0.9 % IV BOLUS (SEPSIS)
1000.0000 mL | Freq: Once | INTRAVENOUS | Status: AC
  Administered 2012-08-16: 1000 mL via INTRAVENOUS

## 2012-08-16 MED ORDER — KETOROLAC TROMETHAMINE 30 MG/ML IJ SOLN
30.0000 mg | Freq: Once | INTRAMUSCULAR | Status: AC
  Administered 2012-08-16: 30 mg via INTRAVENOUS
  Filled 2012-08-16: qty 1

## 2012-08-16 MED ORDER — HYDROMORPHONE HCL PF 1 MG/ML IJ SOLN
1.0000 mg | Freq: Once | INTRAMUSCULAR | Status: AC
  Administered 2012-08-16: 1 mg via INTRAVENOUS
  Filled 2012-08-16: qty 1

## 2012-08-16 MED ORDER — SODIUM CHLORIDE 0.9 % IV SOLN: Freq: Once | INTRAVENOUS | Status: DC

## 2012-08-16 MED ORDER — CYCLOBENZAPRINE HCL 10 MG PO TABS
10.0000 mg | ORAL_TABLET | Freq: Once | ORAL | Status: AC
  Administered 2012-08-16: 10 mg via ORAL
  Filled 2012-08-16: qty 1

## 2012-08-16 NOTE — ED Provider Notes (Signed)
History     CSN: [id number]  Arrival date & time 08/16/12  2206   First MD Initiated Contact with Patient 08/16/12 2304      Chief Complaint  Patient presents with  . Leg Pain    (Consider location/radiation/quality/duration/timing/severity/associated sxs/prior treatment) HPI Evan Black is a 42 y.o. male who presents to the Emergency Department complaining of leg pain for 3 days from the left hip down the leg to the foot. The pain is severe and has not responded to hydrocodone and flexeril. He does not recall having hurt himself or done any strenuous activity.  History reviewed. No pertinent past medical history.  Past Surgical History  Procedure Laterality Date  . Back surgery      History reviewed. No pertinent family history.  History  Substance Use Topics  . Smoking status: Current Every Day Smoker    Types: Cigarettes  . Smokeless tobacco: Not on file  . Alcohol Use: Yes     Comment: occ      Review of Systems  Musculoskeletal:       Left hip pain and left leg pain    Allergies  Review of patient's allergies indicates no known allergies.  Home Medications   Current Outpatient Rx  Name  Route  Sig  Dispense  Refill  . cyclobenzaprine (FLEXERIL) 10 MG tablet   Oral   Take 10 mg by mouth once as needed for muscle spasms.         Marland Kitchen HYDROcodone-acetaminophen (VICODIN) 5-500 MG per tablet   Oral   Take 1 tablet by mouth once as needed for pain.         Marland Kitchen ibuprofen (ADVIL,MOTRIN) 600 MG tablet   Oral   Take 600 mg by mouth once as needed for pain.           BP 169/110  Pulse 73  Temp(Src) 97.5 F (36.4 C) (Oral)  Resp 18  SpO2 94%  Physical Exam  Nursing note and vitals reviewed. Constitutional: He is oriented to person, place, and time. He appears well-developed and well-nourished.  Awake, alert, nontoxic appearance.  HENT:  Head: Normocephalic and atraumatic.  Right Ear: External ear normal.  Left Ear: External ear normal.  Eyes:  EOM are normal. Pupils are equal, round, and reactive to light. Right eye exhibits no discharge. Left eye exhibits no discharge.  Neck: Neck supple.  Cardiovascular: Normal rate and intact distal pulses.   Pulmonary/Chest: Effort normal and breath sounds normal. He exhibits no tenderness.  Abdominal: Soft. Bowel sounds are normal. There is no tenderness. There is no rebound.  Musculoskeletal: He exhibits no tenderness.  Baseline ROM, no obvious new focal weakness.No focal weakness, no lesions, no swelling, movement of ankle, knee, hip on the left side.   Neurological: He is alert and oriented to person, place, and time.  Mental status and motor strength appears baseline for patient and situation.  Skin: No rash noted.  Psychiatric: He has a normal mood and affect.    ED Course  Procedures (including critical care time)  Dg Lumbar Spine Complete  08/17/2012  *RADIOLOGY REPORT*  Clinical Data: Low back pain  LUMBAR SPINE - COMPLETE 4+ VIEW  Comparison: 07/06/2005 MRI  Findings: L4-5 and L5-S1 degenerative changes, with disc height loss, more pronounced at L5-S1.  No acute fracture or dislocation. No aggressive osseous lesion.  Overlying soft tissues unremarkable.  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  No acute osseous finding.   Original Report Authenticated  By: Jearld Lesch, M.D.      MDM  Patient with continuing leg pain x 3 days. Given analgesic and zofran with relief. Xrays do not show anything but DJD. Reviewed results with patient. Pt stable in ED with no significant deterioration in condition.The patient appears reasonably screened and/or stabilized for discharge and I doubt any other medical condition or other [facility] requiring further screening, evaluation, or treatment in the ED at this time prior to discharge.MDM Reviewed: nursing note and vitals Interpretation: x-ray           Nicoletta Dress. Colon Branch, MD 08/17/12 1610

## 2012-08-16 NOTE — ED Notes (Signed)
Left leg pain for 3 days, similar problem in past related to " a nerve" no known injury,

## 2012-08-17 ENCOUNTER — Other Ambulatory Visit: Payer: Self-pay

## 2012-08-17 DIAGNOSIS — I10 Essential (primary) hypertension: Secondary | ICD-10-CM

## 2012-08-17 MED ORDER — HYDROCODONE-ACETAMINOPHEN 5-325 MG PO TABS
1.0000 | ORAL_TABLET | ORAL | Status: DC | PRN
Start: 2012-08-17 — End: 2012-08-17

## 2012-08-17 MED ORDER — ONDANSETRON 4 MG PO TBDP: 4.0000 mg | ORAL_TABLET | Freq: Three times a day (TID) | ORAL | Status: DC | PRN

## 2012-08-17 MED ORDER — AMLODIPINE BESYLATE 5 MG PO TABS: 5.0000 mg | ORAL_TABLET | Freq: Every day | ORAL | Status: DC

## 2012-08-17 MED ORDER — HYDROCODONE-ACETAMINOPHEN 5-325 MG PO TABS: 1.0000 | ORAL_TABLET | ORAL | Status: DC | PRN

## 2012-08-17 NOTE — ED Provider Notes (Signed)
Patient called because he did not want the ondansetron prescription filled but the pharmacy would not fill the Norco prescription and without the ondansetron prescription because it was noted on the same sheet of paper. I reviewed his record on [place] controlled substance reporting website and he has only one other narcotic prescription in the last 6 months she does not appear to be a narcotic abuser. Prescription for Norco was reported without prescription for the ondansetron.  [name], MD 08/17/12 [phone]

## 2012-08-17 NOTE — ED Notes (Signed)
Discharge instructions reviewed with pt, questions answered. Pt verbalized understanding.  

## 2012-08-19 ENCOUNTER — Encounter: Payer: Self-pay | Admitting: Family Medicine

## 2012-08-19 ENCOUNTER — Ambulatory Visit: Payer: 59 | Admitting: Family Medicine

## 2013-01-07 ENCOUNTER — Emergency Department (HOSPITAL_COMMUNITY): Payer: BC Managed Care – PPO

## 2013-01-07 ENCOUNTER — Encounter (HOSPITAL_COMMUNITY): Payer: Self-pay | Admitting: *Deleted

## 2013-01-07 ENCOUNTER — Emergency Department (HOSPITAL_COMMUNITY)
Admission: EM | Admit: 2013-01-07 | Discharge: 2013-01-07 | Disposition: A | Discharge status: Home / Self Care | Payer: BC Managed Care – PPO | Attending: Emergency Medicine | Admitting: Emergency Medicine

## 2013-01-07 DIAGNOSIS — Y9389 Activity, other specified: Secondary | ICD-10-CM | POA: Insufficient documentation

## 2013-01-07 DIAGNOSIS — S8990XA Unspecified injury of unspecified lower leg, initial encounter: Secondary | ICD-10-CM | POA: Insufficient documentation

## 2013-01-07 DIAGNOSIS — S0003XA Contusion of scalp, initial encounter: Secondary | ICD-10-CM | POA: Insufficient documentation

## 2013-01-07 DIAGNOSIS — Z79899 Other long term (current) drug therapy: Secondary | ICD-10-CM | POA: Insufficient documentation

## 2013-01-07 DIAGNOSIS — S0993XA Unspecified injury of face, initial encounter: Secondary | ICD-10-CM | POA: Insufficient documentation

## 2013-01-07 DIAGNOSIS — Y92410 Unspecified street and highway as the place of occurrence of the external cause: Secondary | ICD-10-CM | POA: Insufficient documentation

## 2013-01-07 DIAGNOSIS — Z87891 Personal history of nicotine dependence: Secondary | ICD-10-CM | POA: Insufficient documentation

## 2013-01-07 DIAGNOSIS — S1093XA Contusion of unspecified part of neck, initial encounter: Secondary | ICD-10-CM | POA: Insufficient documentation

## 2013-01-07 DIAGNOSIS — S59909A Unspecified injury of unspecified elbow, initial encounter: Secondary | ICD-10-CM | POA: Insufficient documentation

## 2013-01-07 DIAGNOSIS — M2550 Pain in unspecified joint: Secondary | ICD-10-CM

## 2013-01-07 DIAGNOSIS — S199XXA Unspecified injury of neck, initial encounter: Secondary | ICD-10-CM | POA: Insufficient documentation

## 2013-01-07 DIAGNOSIS — S069X9A Unspecified intracranial injury with loss of consciousness of unspecified duration, initial encounter: Secondary | ICD-10-CM

## 2013-01-07 DIAGNOSIS — S6990XA Unspecified injury of unspecified wrist, hand and finger(s), initial encounter: Secondary | ICD-10-CM | POA: Insufficient documentation

## 2013-01-07 DIAGNOSIS — S060X9A Concussion with loss of consciousness of unspecified duration, initial encounter: Secondary | ICD-10-CM | POA: Insufficient documentation

## 2013-01-07 DIAGNOSIS — I10 Essential (primary) hypertension: Secondary | ICD-10-CM | POA: Insufficient documentation

## 2013-01-07 HISTORY — DX: Essential (primary) hypertension: I10

## 2013-01-07 MED ORDER — IBUPROFEN 600 MG PO TABS: 600.0000 mg | ORAL_TABLET | Freq: Four times a day (QID) | ORAL | Status: DC | PRN

## 2013-01-07 MED ORDER — HYDROCODONE-ACETAMINOPHEN 5-325 MG PO TABS: 1.0000 | ORAL_TABLET | ORAL | Status: DC | PRN

## 2013-01-07 MED ORDER — HYDROCODONE-ACETAMINOPHEN 5-325 MG PO TABS
1.0000 | ORAL_TABLET | Freq: Once | ORAL | Status: AC
  Administered 2013-01-07: 1 via ORAL
  Filled 2013-01-07: qty 1
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
## 2013-01-10 ENCOUNTER — Ambulatory Visit (INDEPENDENT_AMBULATORY_CARE_PROVIDER_SITE_OTHER): Payer: BC Managed Care – PPO | Admitting: Family Medicine

## 2013-01-10 ENCOUNTER — Encounter: Payer: Self-pay | Admitting: Family Medicine

## 2013-01-10 DIAGNOSIS — Z125 Encounter for screening for malignant neoplasm of prostate: Secondary | ICD-10-CM

## 2013-01-10 DIAGNOSIS — E785 Hyperlipidemia, unspecified: Secondary | ICD-10-CM

## 2013-01-10 DIAGNOSIS — E669 Obesity, unspecified: Secondary | ICD-10-CM

## 2013-01-10 DIAGNOSIS — R7309 Other abnormal glucose: Secondary | ICD-10-CM

## 2013-01-10 DIAGNOSIS — R7303 Prediabetes: Secondary | ICD-10-CM

## 2013-01-10 DIAGNOSIS — I10 Essential (primary) hypertension: Secondary | ICD-10-CM

## 2013-01-10 MED ORDER — AMLODIPINE BESYLATE 5 MG PO TABS: 5.0000 mg | ORAL_TABLET | Freq: Every day | ORAL | Status: DC

## 2013-01-10 MED ORDER — CYCLOBENZAPRINE HCL 10 MG PO TABS: 10.0000 mg | ORAL_TABLET | Freq: Three times a day (TID) | ORAL | Status: AC | PRN
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
## 2013-01-11 LAB — CBC
HCT: 40.8 % (ref 39.0–52.0)
Hemoglobin: 13.7 g/dL (ref 13.0–17.0)
MCV: 86.1 fL (ref 78.0–100.0)
RBC: 4.74 MIL/uL (ref 4.22–5.81)
WBC: 7.5 10*3/uL (ref 4.0–10.5)

## 2013-01-12 LAB — BASIC METABOLIC PANEL
BUN: 11 mg/dL (ref 6–23)
Chloride: 102 mEq/L (ref 96–112)
Potassium: 4.3 mEq/L (ref 3.5–5.3)
Sodium: 140 mEq/L (ref 135–145)

## 2013-01-12 LAB — LIPID PANEL
Cholesterol: 184 mg/dL (ref 0–200)
Total CHOL/HDL Ratio: 5.9 Ratio

## 2013-01-12 LAB — HEMOGLOBIN A1C
Hgb A1c MFr Bld: 5.7 % — ABNORMAL HIGH (ref <5.7)
Mean Plasma Glucose: 117 mg/dL — ABNORMAL HIGH (ref <117)

## 2013-01-13 NOTE — ED Provider Notes (Signed)
Medical screening examination/treatment/procedure(s) were performed by non-physician practitioner and as supervising physician I was immediately available for consultation/collaboration.  Raeford Razor, MD 01/13/13 [phone]

## 2013-01-18 NOTE — Assessment & Plan Note (Signed)
Deteriorated. Patient re-educated about  the importance of commitment to a  minimum of 150 minutes of exercise per week. The importance of healthy food choices with portion control discussed. Encouraged to start a food diary, count calories and to consider  joining a support group. Sample diet sheets offered. Goals set by the patient for the next several months.    

## 2013-01-18 NOTE — Assessment & Plan Note (Signed)
Uncontrolled, needs to resume meds

## 2013-01-18 NOTE — Assessment & Plan Note (Signed)
Soft tissue injury on exam, with muscle spasm and generalized pain, ortho to follow  Muscle relaxant added

## 2013-01-19 ENCOUNTER — Ambulatory Visit (HOSPITAL_COMMUNITY)
Admission: RE | Admit: 2013-01-19 | Discharge: 2013-01-19 | Disposition: A | Discharge status: Home / Self Care | Payer: BC Managed Care – PPO | Source: Ambulatory Visit | Attending: Family Medicine | Admitting: Family Medicine

## 2013-01-19 DIAGNOSIS — I10 Essential (primary) hypertension: Secondary | ICD-10-CM | POA: Insufficient documentation

## 2013-01-19 DIAGNOSIS — IMO0001 Reserved for inherently not codable concepts without codable children: Secondary | ICD-10-CM | POA: Insufficient documentation

## 2013-01-19 DIAGNOSIS — M542 Cervicalgia: Secondary | ICD-10-CM | POA: Insufficient documentation
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
## 2013-01-20 ENCOUNTER — Telehealth: Payer: Self-pay | Admitting: Family Medicine

## 2013-01-20 ENCOUNTER — Ambulatory Visit (INDEPENDENT_AMBULATORY_CARE_PROVIDER_SITE_OTHER): Payer: BC Managed Care – PPO | Admitting: Family Medicine

## 2013-01-20 ENCOUNTER — Encounter: Payer: Self-pay | Admitting: Family Medicine

## 2013-01-20 ENCOUNTER — Ambulatory Visit (HOSPITAL_COMMUNITY)
Admission: RE | Admit: 2013-01-20 | Discharge: 2013-01-20 | Disposition: A | Discharge status: Home / Self Care | Payer: BC Managed Care – PPO | Source: Ambulatory Visit | Attending: Family Medicine | Admitting: Family Medicine

## 2013-01-20 VITALS — BP 134/80 | HR 76 | Resp 18 | Ht 71.0 in | Wt 222.0 lb

## 2013-01-20 DIAGNOSIS — M7989 Other specified soft tissue disorders: Secondary | ICD-10-CM | POA: Insufficient documentation

## 2013-01-20 DIAGNOSIS — I10 Essential (primary) hypertension: Secondary | ICD-10-CM

## 2013-01-20 DIAGNOSIS — M79609 Pain in unspecified limb: Secondary | ICD-10-CM | POA: Insufficient documentation

## 2013-01-20 NOTE — Patient Instructions (Addendum)
F/u as before  You are referred for an ultrasound of the right leg to ensure you have no clot in the leg

## 2013-01-20 NOTE — Telephone Encounter (Signed)
Noted that patient has appointment scheduled for this afternoon.

## 2013-01-20 NOTE — Telephone Encounter (Signed)
Aware of this also will keep calling patient
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
## 2013-01-21 ENCOUNTER — Telehealth: Payer: Self-pay | Admitting: Family Medicine

## 2013-01-21 NOTE — Telephone Encounter (Signed)
Dr.Simpson has spoke with patient

## 2013-01-21 NOTE — Telephone Encounter (Signed)
I spoke with wife regarding ultrasound findings, and later with the patient himself. I reviewed the report wioth the radiologist and that discussion is documented in another note

## 2013-01-26 ENCOUNTER — Ambulatory Visit (HOSPITAL_COMMUNITY): Payer: BC Managed Care – PPO | Admitting: *Deleted
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
## 2013-01-30 NOTE — Assessment & Plan Note (Signed)
Controlled, no change in medication  

## 2013-01-31 ENCOUNTER — Ambulatory Visit (HOSPITAL_COMMUNITY)
Admission: RE | Admit: 2013-01-31 | Discharge: 2013-01-31 | Disposition: A | Discharge status: Home / Self Care | Payer: BC Managed Care – PPO | Source: Ambulatory Visit | Attending: Physical Therapy | Admitting: Physical Therapy

## 2013-01-31 DIAGNOSIS — M542 Cervicalgia: Secondary | ICD-10-CM
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
## 2013-02-02 ENCOUNTER — Ambulatory Visit (HOSPITAL_COMMUNITY)
Admission: RE | Admit: 2013-02-02 | Discharge: 2013-02-02 | Disposition: A | Discharge status: Home / Self Care | Payer: BC Managed Care – PPO | Source: Ambulatory Visit | Attending: Physical Therapy | Admitting: Physical Therapy
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
## 2013-02-08 ENCOUNTER — Telehealth (HOSPITAL_COMMUNITY): Payer: Self-pay

## 2013-02-08 ENCOUNTER — Inpatient Hospital Stay (HOSPITAL_COMMUNITY): Admission: RE | Admit: 2013-02-08 | Payer: BC Managed Care – PPO | Source: Ambulatory Visit

## 2013-02-10 ENCOUNTER — Ambulatory Visit (HOSPITAL_COMMUNITY)
Admission: RE | Admit: 2013-02-10 | Discharge: 2013-02-10 | Disposition: A | Discharge status: Home / Self Care | Payer: BC Managed Care – PPO | Source: Ambulatory Visit | Attending: Family Medicine | Admitting: Family Medicine

## 2013-02-10 DIAGNOSIS — IMO0001 Reserved for inherently not codable concepts without codable children: Secondary | ICD-10-CM | POA: Insufficient documentation

## 2013-02-10 DIAGNOSIS — I10 Essential (primary) hypertension: Secondary | ICD-10-CM | POA: Insufficient documentation

## 2013-02-10 DIAGNOSIS — M542 Cervicalgia: Secondary | ICD-10-CM | POA: Insufficient documentation
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
## 2013-02-14 ENCOUNTER — Ambulatory Visit (HOSPITAL_COMMUNITY)
Admission: RE | Admit: 2013-02-14 | Discharge: 2013-02-14 | Disposition: A | Discharge status: Home / Self Care | Payer: BC Managed Care – PPO | Source: Ambulatory Visit | Attending: Family Medicine | Admitting: Family Medicine
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
## 2013-02-16 ENCOUNTER — Ambulatory Visit (HOSPITAL_COMMUNITY)
Admission: RE | Admit: 2013-02-16 | Discharge: 2013-02-16 | Disposition: A | Discharge status: Home / Self Care | Payer: BC Managed Care – PPO | Source: Ambulatory Visit

## 2013-02-16 DIAGNOSIS — M542 Cervicalgia: Secondary | ICD-10-CM
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
## 2013-03-01 ENCOUNTER — Ambulatory Visit (HOSPITAL_COMMUNITY)
Admission: RE | Admit: 2013-03-01 | Discharge: 2013-03-01 | Disposition: A | Discharge status: Home / Self Care | Payer: BC Managed Care – PPO | Source: Ambulatory Visit | Attending: Family Medicine | Admitting: Family Medicine

## 2013-03-01 DIAGNOSIS — M542 Cervicalgia: Secondary | ICD-10-CM
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
## 2013-04-18 ENCOUNTER — Encounter: Payer: Self-pay | Admitting: Family Medicine

## 2013-04-18 ENCOUNTER — Ambulatory Visit (INDEPENDENT_AMBULATORY_CARE_PROVIDER_SITE_OTHER): Payer: BC Managed Care – PPO | Admitting: Family Medicine

## 2013-04-18 ENCOUNTER — Encounter (INDEPENDENT_AMBULATORY_CARE_PROVIDER_SITE_OTHER): Payer: Self-pay

## 2013-04-18 VITALS — BP 132/82 | HR 74 | Resp 18 | Ht 71.0 in | Wt 210.1 lb

## 2013-04-18 DIAGNOSIS — E785 Hyperlipidemia, unspecified: Secondary | ICD-10-CM

## 2013-04-18 DIAGNOSIS — R7309 Other abnormal glucose: Secondary | ICD-10-CM

## 2013-04-18 DIAGNOSIS — I10 Essential (primary) hypertension: Secondary | ICD-10-CM

## 2013-04-18 DIAGNOSIS — E669 Obesity, unspecified: Secondary | ICD-10-CM

## 2013-04-18 DIAGNOSIS — R7303 Prediabetes: Secondary | ICD-10-CM

## 2013-04-18 DIAGNOSIS — R5381 Other malaise: Secondary | ICD-10-CM

## 2013-04-18 NOTE — Progress Notes (Signed)
  Subjective:    Patient ID: Evan Black, male    DOB: May 31, 1971, 42 y.o.   MRN: [id number]  HPI The PT is here for follow up and re-evaluation of chronic medical conditions, medication management and review of any available recent lab and radiology data.  Preventive health is updated, specifically  Cancer screening and Immunization.   Questions or concerns regarding consultations or procedures which the PT has had in the interim are  addressed. The PT denies any adverse reactions to current medications since the last visit.  There are no new concerns. Doing very well with change in diet and weight loss There are no specific complaints       Review of Systems See HPI Denies recent fever or chills. Denies sinus pressure, nasal congestion, ear pain or sore throat. Denies chest congestion, productive cough or wheezing. Denies chest pains, palpitations and leg swelling Denies abdominal pain, nausea, vomiting,diarrhea or constipation.   Denies dysuria, frequency, hesitancy or incontinence. Denies joint pain, swelling and limitation in mobility. Denies headaches, seizures, numbness, or tingling. Denies depression, anxiety or insomnia. Denies skin break down or rash.        Objective:   Physical Exam  Patient alert and oriented and in no cardiopulmonary distress.  HEENT: No facial asymmetry, EOMI, no sinus tenderness,  oropharynx pink and moist.  Neck supple no adenopathy.  Chest: Clear to auscultation bilaterally.  CVS: S1, S2 no murmurs, no S3.  ABD: Soft non tender. Bowel sounds normal.  Ext: No edema  MS: Adequate ROM spine, shoulders, hips and knees.  Skin: Intact, no ulcerations or rash noted.  Psych: Good eye contact, normal affect. Memory intact not anxious or depressed appearing.  CNS: CN 2-12 intact, power, tone and sensation normal throughout.       Assessment & Plan:

## 2013-04-18 NOTE — Patient Instructions (Signed)
F/u in 4.5 month  Fasting HBA1C, and chem7 and and lipid in 4.5 month  CONGRATS , keep it up, and commit to exercise at least 3 to 4 days per week for 30 minutes

## 2013-05-29 NOTE — Assessment & Plan Note (Signed)
Hyperlipidemia:Low fat diet discussed and encouraged.  No med management at this time

## 2013-05-29 NOTE — Assessment & Plan Note (Signed)
Improved. Pt applauded on succesful weight loss through lifestyle change, and encouraged to continue same. Weight loss goal set for the next several months.  

## 2013-05-29 NOTE — Assessment & Plan Note (Signed)
Controlled, no change in medication DASH diet and commitment to daily physical activity for a minimum of 30 minutes discussed and encouraged, as a part of hypertension management. The importance of attaining a healthy weight is also discussed.  

## 2013-05-29 NOTE — Assessment & Plan Note (Signed)
Improved HBA1C but not yet at goal Patient educated about the importance of limiting  Carbohydrate intake , the need to commit to daily physical activity for a minimum of 30 minutes , and to commit weight loss. The fact that changes in all these areas will reduce or eliminate all together the development of diabetes is stressed.

## 2013-05-31 ENCOUNTER — Encounter: Payer: Self-pay | Admitting: Cardiology

## 2013-05-31 ENCOUNTER — Ambulatory Visit (HOSPITAL_COMMUNITY): Payer: BC Managed Care – PPO | Attending: Orthopedic Surgery

## 2013-05-31 DIAGNOSIS — M79609 Pain in unspecified limb: Secondary | ICD-10-CM

## 2013-05-31 DIAGNOSIS — M7989 Other specified soft tissue disorders: Secondary | ICD-10-CM | POA: Insufficient documentation

## 2013-05-31 DIAGNOSIS — R609 Edema, unspecified: Secondary | ICD-10-CM

## 2013-06-24 ENCOUNTER — Telehealth: Payer: Self-pay

## 2013-06-24 ENCOUNTER — Other Ambulatory Visit: Payer: Self-pay

## 2013-06-24 MED ORDER — OSELTAMIVIR PHOSPHATE 75 MG PO CAPS: 75.0000 mg | ORAL_CAPSULE | Freq: Two times a day (BID) | ORAL | Status: DC

## 2013-06-24 NOTE — Telephone Encounter (Signed)
pls send in tamiflu 75 mg twice daily #10 only, also advise tylenol for fever and pain and a lot of fluids and rest. Cal;l back/go to Ed if symptoms worsen in next 48 hrs

## 2013-06-24 NOTE — Telephone Encounter (Signed)
X 2 days, fever, chills, bodyaches and nasal congestion. Patient sitting in chair under heavy comforter and is still cold. Requesting tamiflu be sent to his pharmacy

## 2013-06-24 NOTE — Telephone Encounter (Signed)
Patient aware.

## 2013-08-01 ENCOUNTER — Ambulatory Visit (INDEPENDENT_AMBULATORY_CARE_PROVIDER_SITE_OTHER): Payer: BC Managed Care – PPO | Admitting: Family Medicine

## 2013-08-01 ENCOUNTER — Encounter (INDEPENDENT_AMBULATORY_CARE_PROVIDER_SITE_OTHER): Payer: Self-pay

## 2013-08-01 ENCOUNTER — Encounter: Payer: Self-pay | Admitting: Family Medicine

## 2013-08-01 VITALS — BP 142/90 | HR 74 | Resp 18 | Ht 71.0 in | Wt 211.1 lb

## 2013-08-01 DIAGNOSIS — M549 Dorsalgia, unspecified: Secondary | ICD-10-CM

## 2013-08-01 DIAGNOSIS — I10 Essential (primary) hypertension: Secondary | ICD-10-CM

## 2013-08-01 MED ORDER — IBUPROFEN 800 MG PO TABS: 800.0000 mg | ORAL_TABLET | Freq: Three times a day (TID) | ORAL | Status: DC | PRN

## 2013-08-01 MED ORDER — PREDNISONE (PAK) 5 MG PO TABS: 5.0000 mg | ORAL_TABLET | ORAL | Status: DC

## 2013-08-01 MED ORDER — HYDROCODONE-ACETAMINOPHEN 5-325MG PREPACK (~~LOC~~: ORAL_TABLET | ORAL | Status: DC

## 2013-08-01 MED ORDER — METHYLPREDNISOLONE ACETATE 80 MG/ML IJ SUSP
80.0000 mg | Freq: Once | INTRAMUSCULAR | Status: AC
  Administered 2013-08-01: 80 mg via INTRAMUSCULAR

## 2013-08-01 MED ORDER — CYCLOBENZAPRINE HCL 10 MG PO TABS: 10.0000 mg | ORAL_TABLET | Freq: Three times a day (TID) | ORAL | Status: DC | PRN

## 2013-08-01 NOTE — Patient Instructions (Signed)
F/u as before  You are treated for acute sciatica affecting left thigh.  Depo medrol in office and medication is also sent to your pharmacy. Take the ibuprofen and prednisone as prescribed, the muscle relaxant ( flexeril), has a sedative side effect, so you needd to be careful with this, and may need to take onlly at bedtime Limited number of hydrocodone is prescribed  Work excuse from today to return on 08/03/2013.  Call if symptoms persist Nexium one daily for 1 week to protect your stomach will be given as samples from the office

## 2013-08-02 ENCOUNTER — Encounter: Payer: Self-pay | Admitting: Family Medicine

## 2013-08-02 NOTE — Progress Notes (Signed)
   Subjective:    Patient ID: Evan Black, male    DOB: Dec 21, 1970, 43 y.o.   MRN: [id number]  HPI 4 day h/o acute back pian radiating down left buttock and post left thigh to knee. No specific initiating incident, has had this problem in the past, as well as has had back surgery. Movement and certain positions make it worse. No lower extremity weakness or numbness, no incontinence, no fever, chills or urinary symptoms Prior to this has been well   Review of Systems See HPI Denies recent fever or chills. Denies sinus pressure, nasal congestion, ear pain or sore throat. Denies chest congestion, productive cough or wheezing. Denies chest pains, palpitations and leg swelling Denies abdominal pain, nausea, vomiting,diarrhea or constipation.   Denies dysuria, frequency, hesitancy or incontinence.  Denies depression, anxiety or insomnia. Denies skin break down or rash.        Objective:   Physical Exam BP 142/90  Pulse 74  Resp 18  Ht 5\' 11"  (1.803 m)  Wt 211 lb 1.3 oz (95.745 kg)  BMI 29.45 kg/m2  SpO2 96% Patient in pain, lying curled up in fetal position Patient alert and oriented and in no cardiopulmonary distress.  HEENT: No facial asymmetry, EOMI, no sinus tenderness,  oropharynx pink and moist.  Neck supple no adenopathy.  Chest: Clear to auscultation bilaterally.  CVS: S1, S2 no murmurs, no S3.  ABD: Soft non tender. Bowel sounds normal.  Ext: No edema  MS: decreased  ROM thoraco lumbar spine, adequate in  Shoulders,decreased in right  hip and knee.  Skin: Intact, no ulcerations or rash noted.  Psych: Good eye contact, normal affect. Memory intact not anxious or depressed appearing.  CNS: CN 2-12 intact, power, tone and sensation normal throughout.        Assessment & Plan:  Back pain, acute Depo medrol in office followed by short sharp course of anti inflamatories, muscle relaxant and limited vicodin Work excuse for 2 days, call if worsens/does not  resolve  HYPERTENSION elevatred at this visit, likley due to pain, no med change at this time DASH diet and commitment to daily physical activity for a minimum of 30 minutes discussed and encouraged, as a part of hypertension management. The importance of attaining a healthy weight is also discussed.

## 2013-08-02 NOTE — Assessment & Plan Note (Signed)
Depo medrol in office followed by short sharp course of anti inflamatories, muscle relaxant and limited vicodin Work excuse for 2 days, call if worsens/does not resolve

## 2013-08-02 NOTE — Assessment & Plan Note (Signed)
elevatred at this visit, likley due to pain, no med change at this time DASH diet and commitment to daily physical activity for a minimum of 30 minutes discussed and encouraged, as a part of hypertension management. The importance of attaining a healthy weight is also discussed.

## 2013-08-03 ENCOUNTER — Telehealth: Payer: Self-pay

## 2013-08-03 NOTE — Telephone Encounter (Signed)
Based on how he presented OK to extend, remind him, if not improving he needs another MRI, let me know if that is the case ,  He has already had back surgery

## 2013-08-03 NOTE — Telephone Encounter (Signed)
Correction:  Patient called back stating that he plans to try to return to work on Thursday 2/26. He does not want to miss work for more than 3 days due to finances.   Work note will be changed to cover 2/25.

## 2013-09-23 ENCOUNTER — Other Ambulatory Visit: Payer: Self-pay

## 2013-09-23 DIAGNOSIS — I10 Essential (primary) hypertension: Secondary | ICD-10-CM

## 2013-09-23 MED ORDER — AMLODIPINE BESYLATE 5 MG PO TABS: 5.0000 mg | ORAL_TABLET | Freq: Every day | ORAL | Status: DC

## 2013-11-28 ENCOUNTER — Other Ambulatory Visit: Payer: Self-pay | Admitting: Orthopedic Surgery

## 2013-11-28 DIAGNOSIS — M79604 Pain in right leg: Secondary | ICD-10-CM

## 2013-11-28 DIAGNOSIS — M545 Low back pain: Secondary | ICD-10-CM

## 2013-12-12 ENCOUNTER — Ambulatory Visit
Admission: RE | Admit: 2013-12-12 | Discharge: 2013-12-12 | Disposition: A | Discharge status: Home / Self Care | Payer: BC Managed Care – PPO | Source: Ambulatory Visit | Attending: Orthopedic Surgery | Admitting: Orthopedic Surgery

## 2013-12-12 DIAGNOSIS — M79604 Pain in right leg: Secondary | ICD-10-CM

## 2013-12-12 DIAGNOSIS — M545 Low back pain: Secondary | ICD-10-CM

## 2013-12-22 ENCOUNTER — Other Ambulatory Visit: Payer: Self-pay

## 2013-12-22 DIAGNOSIS — I10 Essential (primary) hypertension: Secondary | ICD-10-CM

## 2013-12-22 MED ORDER — AMLODIPINE BESYLATE 5 MG PO TABS: 5.0000 mg | ORAL_TABLET | Freq: Every day | ORAL | Status: DC

## 2014-05-29 ENCOUNTER — Encounter: Payer: Self-pay | Admitting: Family Medicine

## 2014-05-29 ENCOUNTER — Encounter (INDEPENDENT_AMBULATORY_CARE_PROVIDER_SITE_OTHER): Payer: Self-pay

## 2014-05-29 ENCOUNTER — Ambulatory Visit (INDEPENDENT_AMBULATORY_CARE_PROVIDER_SITE_OTHER): Payer: BC Managed Care – PPO | Admitting: Family Medicine

## 2014-05-29 VITALS — BP 122/82 | HR 83 | Resp 16 | Ht 71.0 in | Wt 209.0 lb

## 2014-05-29 DIAGNOSIS — E8881 Metabolic syndrome: Secondary | ICD-10-CM

## 2014-05-29 DIAGNOSIS — IMO0001 Reserved for inherently not codable concepts without codable children: Secondary | ICD-10-CM

## 2014-05-29 DIAGNOSIS — F17200 Nicotine dependence, unspecified, uncomplicated: Secondary | ICD-10-CM

## 2014-05-29 DIAGNOSIS — F17290 Nicotine dependence, other tobacco product, uncomplicated: Secondary | ICD-10-CM

## 2014-05-29 DIAGNOSIS — K219 Gastro-esophageal reflux disease without esophagitis: Secondary | ICD-10-CM

## 2014-05-29 DIAGNOSIS — I10 Essential (primary) hypertension: Secondary | ICD-10-CM

## 2014-05-29 DIAGNOSIS — Z125 Encounter for screening for malignant neoplasm of prostate: Secondary | ICD-10-CM

## 2014-05-29 DIAGNOSIS — R7303 Prediabetes: Secondary | ICD-10-CM

## 2014-05-29 DIAGNOSIS — E785 Hyperlipidemia, unspecified: Secondary | ICD-10-CM

## 2014-05-29 DIAGNOSIS — R7309 Other abnormal glucose: Secondary | ICD-10-CM

## 2014-05-29 DIAGNOSIS — J301 Allergic rhinitis due to pollen: Secondary | ICD-10-CM

## 2014-05-29 MED ORDER — LORATADINE 10 MG PO TBDP: 10.0000 mg | ORAL_TABLET | Freq: Every day | ORAL | Status: DC

## 2014-05-29 MED ORDER — MOMETASONE FUROATE 50 MCG/ACT NA SUSP: NASAL | Status: DC

## 2014-05-29 MED ORDER — ESOMEPRAZOLE MAGNESIUM 40 MG PO PACK: 40.0000 mg | PACK | Freq: Every day | ORAL | Status: DC

## 2014-05-29 NOTE — Patient Instructions (Signed)
Physical exam in 6 month, call if you need me before  Plan to stop smoking by 06/09/2014 , entirely    Daily allergy medication , nasonex, and loratidine.   Fasting labs in 2 days   BP is good  Six pounds off in 6 month  Gastroesophageal Reflux Disease, Adult Gastroesophageal reflux disease (GERD) happens when acid from your stomach flows up into the esophagus. When acid comes in contact with the esophagus, the acid causes soreness (inflammation) in the esophagus. Over time, GERD may create small holes (ulcers) in the lining of the esophagus. CAUSES   Increased body weight. This puts pressure on the stomach, making acid rise from the stomach into the esophagus.  Smoking. This increases acid production in the stomach.  Drinking alcohol. This causes decreased pressure in the lower esophageal sphincter (valve or ring of muscle between the esophagus and stomach), allowing acid from the stomach into the esophagus.  Late evening meals and a full stomach. This increases pressure and acid production in the stomach.  A malformed lower esophageal sphincter. Sometimes, no cause is found. SYMPTOMS   Burning pain in the lower part of the mid-chest behind the breastbone and in the mid-stomach area. This may occur twice a week or more often.  Trouble swallowing.  Sore throat.  Dry cough.  Asthma-like symptoms including chest tightness, shortness of breath, or wheezing. DIAGNOSIS  Your caregiver may be able to diagnose GERD based on your symptoms. In some cases, X-rays and other tests may be done to check for complications or to check the condition of your stomach and esophagus. TREATMENT  Your caregiver may recommend over-the-counter or prescription medicines to help decrease acid production. Ask your caregiver before starting or adding any new medicines.  HOME CARE INSTRUCTIONS   Change the factors that you can control. Ask your caregiver for guidance concerning weight loss,  quitting smoking, and alcohol consumption.  Avoid foods and drinks that make your symptoms worse, such as:  Caffeine or alcoholic drinks.  Chocolate.  Peppermint or mint flavorings.  Garlic and onions.  Spicy foods.  Citrus fruits, such as oranges, lemons, or limes.  Tomato-based foods such as sauce, chili, salsa, and pizza.  Fried and fatty foods.  Avoid lying down for the 3 hours prior to your bedtime or prior to taking a nap.  Eat small, frequent meals instead of large meals.  Wear loose-fitting clothing. Do not wear anything tight around your waist that causes pressure on your stomach.  Raise the head of your bed 6 to 8 inches with wood blocks to help you sleep. Extra pillows will not help.  Only take over-the-counter or prescription medicines for pain, discomfort, or fever as directed by your caregiver.  Do not take aspirin, ibuprofen, or other nonsteroidal anti-inflammatory drugs (NSAIDs). SEEK IMMEDIATE MEDICAL CARE IF:   You have pain in your arms, neck, jaw, teeth, or back.  Your pain increases or changes in intensity or duration.  You develop nausea, vomiting, or sweating (diaphoresis).  You develop shortness of breath, or you faint.  Your vomit is green, yellow, black, or looks like coffee grounds or blood.  Your stool is red, bloody, or black. These symptoms could be signs of other problems, such as heart disease, gastric bleeding, or esophageal bleeding. MAKE SURE YOU:   Understand these instructions.  Will watch your condition.  Will get help right away if you are not doing well or get worse. Document Released: 03/05/2005 Document Revised: 08/18/2011 Document Reviewed: 12/13/2010 [employer]  Patient Information 2015 [employer]. This information is not intended to replace advice given to you by your health care provider. Make sure you discuss any questions you have with your health care provider.  

## 2014-06-01 ENCOUNTER — Other Ambulatory Visit: Payer: Self-pay | Admitting: Family Medicine

## 2014-06-01 LAB — COMPREHENSIVE METABOLIC PANEL
ALT: 28 U/L (ref 0–53)
AST: 24 U/L (ref 0–37)
Albumin: 4.1 g/dL (ref 3.5–5.2)
Alkaline Phosphatase: 46 U/L (ref 39–117)
BUN: 13 mg/dL (ref 6–23)
CO2: 24 mEq/L (ref 19–32)
Calcium: 9 mg/dL (ref 8.4–10.5)
Chloride: 106 mEq/L (ref 96–112)
Creat: 0.95 mg/dL (ref 0.50–1.35)
GLUCOSE: 90 mg/dL (ref 70–99)
POTASSIUM: 4 meq/L (ref 3.5–5.3)
Sodium: 140 mEq/L (ref 135–145)
Total Bilirubin: 0.4 mg/dL (ref 0.2–1.2)
Total Protein: 6.6 g/dL (ref 6.0–8.3)

## 2014-06-01 LAB — HEMOGLOBIN A1C
Hgb A1c MFr Bld: 6.1 % — ABNORMAL HIGH (ref <5.7)
Mean Plasma Glucose: 128 mg/dL — ABNORMAL HIGH (ref <117)

## 2014-06-01 LAB — CBC
HEMATOCRIT: 41.9 % (ref 39.0–52.0)
HEMOGLOBIN: 14.3 g/dL (ref 13.0–17.0)
MCH: 29.5 pg (ref 26.0–34.0)
MCHC: 34.1 g/dL (ref 30.0–36.0)
MCV: 86.4 fL (ref 78.0–100.0)
MPV: 9.7 fL (ref 9.4–12.4)
Platelets: 318 10*3/uL (ref 150–400)
RBC: 4.85 MIL/uL (ref 4.22–5.81)
RDW: 13.4 % (ref 11.5–15.5)
WBC: 9.8 10*3/uL (ref 4.0–10.5)

## 2014-06-01 LAB — LIPID PANEL
CHOLESTEROL: 184 mg/dL (ref 0–200)
HDL: 22 mg/dL — AB (ref >39)
TRIGLYCERIDES: 435 mg/dL — AB (ref <150)
Total CHOL/HDL Ratio: 8.4 Ratio

## 2014-06-01 LAB — PSA: PSA: 0.32 ng/mL (ref <=4.00)

## 2014-06-01 LAB — TSH: TSH: 2.294 u[IU]/mL (ref 0.350–4.500)

## 2014-06-05 ENCOUNTER — Encounter: Payer: Self-pay | Admitting: Family Medicine

## 2014-06-05 DIAGNOSIS — E8881 Metabolic syndrome: Secondary | ICD-10-CM | POA: Insufficient documentation

## 2014-06-05 DIAGNOSIS — F17200 Nicotine dependence, unspecified, uncomplicated: Secondary | ICD-10-CM | POA: Insufficient documentation

## 2014-06-05 LAB — H. PYLORI ANTIBODY, IGG: H Pylori IgG: >8 {ISR} — ABNORMAL HIGH

## 2014-06-05 LAB — LDL CHOLESTEROL, DIRECT: LDL DIRECT: 86 mg/dL

## 2014-06-05 NOTE — Assessment & Plan Note (Signed)
Markedly elevated TG Hyperlipidemia:Low fat diet discussed and encouraged.  Updated lab needed at/ before next visit.

## 2014-06-05 NOTE — Assessment & Plan Note (Signed)
The increased risk of cardiovascular disease associated with this diagnosis, and the need to consistently work on lifestyle to change this is discussed. Following  a  heart healthy diet ,commitment to 30 minutes of exercise at least 5 days per week, as well as control of blood sugar and cholesterol , and achieving a healthy weight are all the areas to be addressed .  

## 2014-06-05 NOTE — Assessment & Plan Note (Signed)
Deteriorated , had quit , but now smokes at least 3 ciggs daily, unwilling to set a quit date Patient counseled for approximately 5 minutes regarding the health risks of ongoing nicotine use, specifically all types of cancer, heart disease, stroke and respiratory failure. The options available for help with cessation ,the behavioral changes to assist the process, and the option to either gradully reduce usage  Or abruptly stop.is also discussed. Pt is also encouraged to set specific goals in number of cigarettes used daily, as well as to set a quit date.

## 2014-06-05 NOTE — Assessment & Plan Note (Signed)
Improved. Pt applauded on succesful weight loss through lifestyle change, and encouraged to continue same. Weight loss goal set for the next several months.  

## 2014-06-05 NOTE — Assessment & Plan Note (Signed)
Increased symptoms, need to check H pylori status and PPI prescribed also

## 2014-06-05 NOTE — Assessment & Plan Note (Signed)
Patient educated about the importance of limiting  Carbohydrate intake , the need to commit to daily physical activity for a minimum of 30 minutes , and to commit weight loss. The fact that changes in all these areas will reduce or eliminate all together the development of diabetes is stressed.   Updated lab needed at/ before next visit.  

## 2014-06-05 NOTE — Progress Notes (Signed)
Subjective:    Patient ID: Evan Black, male    DOB: 22-Feb-1971, 43 y.o.   MRN: [id number]  HPI The PT is here for follow up and re-evaluation of chronic medical conditions, medication management and review of any available recent lab and radiology data.  Preventive health is updated, specifically  Cancer screening and Immunization.  Refuses flu vaccine The PT denies any adverse reactions to current medications since the last visit.  C/o increased and uncontrolled reflux symptoms, and has started smoking again, on avg 3 per day , habit reportedly post eating       Review of Systems See HPI Denies recent fever or chills. Denies sinus pressure, nasal congestion, ear pain or sore throat. Denies chest congestion, productive cough or wheezing. Denies chest pains, palpitations and leg swelling Denies  nausea, vomiting,diarrhea or constipation.   Denies dysuria, frequency, hesitancy or incontinence. Denies joint pain, swelling and limitation in mobility. Denies headaches, seizures, numbness, or tingling. Denies depression, anxiety or insomnia. Denies skin break down or rash.        Objective:   Physical Exam  BP 122/82 mmHg  Pulse 83  Resp 16  Ht 5\' 11"  (1.803 m)  Wt 209 lb (94.802 kg)  BMI 29.16 kg/m2  SpO2 98% Patient alert and oriented and in no cardiopulmonary distress.  HEENT: No facial asymmetry, EOMI,   oropharynx pink and moist.  Neck supple no JVD, no mass.  Chest: Clear to auscultation bilaterally.  CVS: S1, S2 no murmurs, no S3.Regular rate.  ABD: Soft non tender.   Ext: No edema  MS: Adequate ROM spine, shoulders, hips and knees.  Skin: Intact, no ulcerations or rash noted.  Psych: Good eye contact, normal affect. Memory intact not anxious or depressed appearing.  CNS: CN 2-12 intact, power,  normal throughout.no focal deficits noted.       Assessment & Plan:  Essential hypertension Controlled, no change in medication DASH diet and  commitment to daily physical activity for a minimum of 30 minutes discussed and encouraged, as a part of hypertension management. The importance of attaining a healthy weight is also discussed.   ALLERGIC RHINITIS, SEASONAL Uncontrolled, needs to commit to daily medication and use of sudafed and saline flushes as needed  GERD (gastroesophageal reflux disease) Increased symptoms, need to check H pylori status and PPI prescribed also  Prediabetes Patient educated about the importance of limiting  Carbohydrate intake , the need to commit to daily physical activity for a minimum of 30 minutes , and to commit weight loss. The fact that changes in all these areas will reduce or eliminate all together the development of diabetes is stressed.   Updated lab needed at/ before next visit.   Hyperlipidemia with target LDL less than 100 Markedly elevated TG Hyperlipidemia:Low fat diet discussed and encouraged.  Updated lab needed at/ before next visit.   Obesity, Class II, BMI 35-39.9, with comorbidity Improved. Pt applauded on succesful weight loss through lifestyle change, and encouraged to continue same. Weight loss goal set for the next several months.   Metabolic syndrome X The increased risk of cardiovascular disease associated with this diagnosis, and the need to consistently work on lifestyle to change this is discussed. Following  a  heart healthy diet ,commitment to 30 minutes of exercise at least 5 days per week, as well as control of blood sugar and cholesterol , and achieving a healthy weight are all the areas to be addressed .   Nicotine dependence Deteriorated ,  had quit , but now smokes at least 3 ciggs daily, unwilling to set a quit date Patient counseled for approximately 5 minutes regarding the health risks of ongoing nicotine use, specifically all types of cancer, heart disease, stroke and respiratory failure. The options available for help with cessation ,the behavioral  changes to assist the process, and the option to either gradully reduce usage  Or abruptly stop.is also discussed. Pt is also encouraged to set specific goals in number of cigarettes used daily, as well as to set a quit date.

## 2014-06-05 NOTE — Assessment & Plan Note (Signed)
Controlled, no change in medication DASH diet and commitment to daily physical activity for a minimum of 30 minutes discussed and encouraged, as a part of hypertension management. The importance of attaining a healthy weight is also discussed.  

## 2014-06-05 NOTE — Assessment & Plan Note (Signed)
Uncontrolled, needs to commit to daily medication and use of sudafed and saline flushes as needed

## 2014-06-07 MED ORDER — AMOXICILL-CLARITHRO-LANSOPRAZ PO MISC: Freq: Two times a day (BID) | ORAL | Status: DC

## 2014-06-07 NOTE — Addendum Note (Signed)
Addended by: Denman George B on: 06/07/2014 11:34 AM   Modules accepted: Orders

## 2014-11-23 ENCOUNTER — Other Ambulatory Visit: Payer: Self-pay | Admitting: Family Medicine

## 2015-02-09 ENCOUNTER — Other Ambulatory Visit: Payer: Self-pay | Admitting: Family Medicine

## 2015-03-12 ENCOUNTER — Ambulatory Visit (INDEPENDENT_AMBULATORY_CARE_PROVIDER_SITE_OTHER): Payer: BLUE CROSS/BLUE SHIELD | Admitting: Family Medicine

## 2015-03-12 ENCOUNTER — Encounter: Payer: Self-pay | Admitting: Family Medicine

## 2015-03-12 VITALS — BP 132/80 | HR 96 | Resp 18 | Ht 71.0 in | Wt 208.1 lb

## 2015-03-12 DIAGNOSIS — F17208 Nicotine dependence, unspecified, with other nicotine-induced disorders: Secondary | ICD-10-CM | POA: Diagnosis not present

## 2015-03-12 DIAGNOSIS — I10 Essential (primary) hypertension: Secondary | ICD-10-CM | POA: Diagnosis not present

## 2015-03-12 DIAGNOSIS — Z1211 Encounter for screening for malignant neoplasm of colon: Secondary | ICD-10-CM | POA: Diagnosis not present

## 2015-03-12 DIAGNOSIS — Z114 Encounter for screening for human immunodeficiency virus [HIV]: Secondary | ICD-10-CM

## 2015-03-12 DIAGNOSIS — Z125 Encounter for screening for malignant neoplasm of prostate: Secondary | ICD-10-CM

## 2015-03-12 DIAGNOSIS — Z0000 Encounter for general adult medical examination without abnormal findings: Secondary | ICD-10-CM | POA: Diagnosis not present

## 2015-03-12 DIAGNOSIS — E785 Hyperlipidemia, unspecified: Secondary | ICD-10-CM

## 2015-03-12 DIAGNOSIS — R7303 Prediabetes: Secondary | ICD-10-CM

## 2015-03-12 LAB — POC HEMOCCULT BLD/STL (OFFICE/1-CARD/DIAGNOSTIC): Fecal Occult Blood, POC: NEGATIVE

## 2015-03-12 NOTE — Assessment & Plan Note (Signed)

## 2015-03-12 NOTE — Patient Instructions (Addendum)
F/u in 6 month, call if you need me beforwe  Pls DEFINITELY plan to STOP smoking and cut back on beer by 06/09/2015  Fasting labs in 2 weeks please  It is important that you exercise regularly at least 30 minutes 5 times a week. If you develop chest pain, have severe difficulty breathing, or feel very tired, stop exercising immediately and seek medical attention   Thanks for choosing  Primary Care, we consider it a privelige to serve you.   Come in for flu vaccine if you change your mind!

## 2015-03-12 NOTE — Assessment & Plan Note (Signed)

## 2015-03-12 NOTE — Progress Notes (Signed)
   Subjective:    Patient ID: Evan Black, male    DOB: Jun 02, 1971, 44 y.o.   MRN: [id number]  HPI Patient is in for annual physical exam. No other health concerns are expressed or addressed at the visit. Recent labs, if available are reviewed. Immunization is reviewed , and  updated if needed.    Review of Systems See HPI   Objective:   Physical Exam BP 132/80 mmHg  Pulse 96  Resp 18  Ht 5\' 11"  (1.803 m)  Wt 208 lb 1.9 oz (94.403 kg)  BMI 29.04 kg/m2  SpO2 98%  Pleasant well nourished male, alert and oriented x 3, in no cardio-pulmonary distress. Afebrile. HEENT No facial trauma or asymetry. Sinuses non tender. EOMI, PERTL, . External ears normal, tympanic membranes clear. Oropharynx moist, no exudate, fairly  good dentition. Neck: supple, no adenopathy,JVD or thyromegaly.No bruits.  Chest: Clear to ascultation bilaterally.No crackles or wheezes. Non tender to palpation  Breast: No asymetry,no masses. No nipple discharge or inversion. No axillary or supraclavicular adenopathy  Cardiovascular system; Heart sounds normal,  S1 and  S2 ,no S3.  No murmur, or thrill. Apical beat not displaced Peripheral pulses normal.  Abdomen: Soft, non tender, no organomegaly or masses. No bruits. Bowel sounds normal. No guarding, tenderness or rebound.  Rectal:  Normal sphincter tone. No hemorrhoids or  masses. guaiac negative stool. Prostate smooth and firm  GU: Not examined  Musculoskeletal exam: Full ROM of spine, hips , shoulders and knees. No deformity ,swelling or crepitus noted. No muscle wasting or atrophy.   Neurologic: Cranial nerves 2 to 12 intact. Power, tone ,sensation and reflexes normal throughout. No disturbance in gait. No tremor.  Skin: Intact, no ulceration, erythema , scaling or rash noted. Pigmentation normal throughout  Psych; Normal mood and affect. Judgement and concentration normal        Assessment & Plan:  Annual physical  exam Annual exam as documented. Counseling done  re healthy lifestyle involving commitment to 150 minutes exercise per week, heart healthy diet, and attaining healthy weight.The importance of adequate sleep also discussed. Regular seat belt use and home safety, is also discussed. Changes in health habits are decided on by the patient with goals and time frames  set for achieving them. Immunization and cancer screening needs are specifically addressed at this visit.   Nicotine dependence Patient counseled for approximately 5 minutes regarding the health risks of ongoing nicotine use, specifically all types of cancer, heart disease, stroke and respiratory failure. The options available for help with cessation ,the behavioral changes to assist the process, and the option to either gradully reduce usage  Or abruptly stop.is also discussed. Pt is also encouraged to set specific goals in number of cigarettes used daily, as well as to set a quit date.  Number of cigarettes/cigars currently smoking daily:3

## 2015-03-13 NOTE — Addendum Note (Signed)
Addended by: Denman George B on: 03/13/2015 03:22 PM   Modules accepted: Orders

## 2015-03-31 LAB — BASIC METABOLIC PANEL WITH GFR
BUN: 12 mg/dL (ref 7–25)
CALCIUM: 9.6 mg/dL (ref 8.6–10.3)
CHLORIDE: 104 mmol/L (ref 98–110)
CO2: 31 mmol/L (ref 20–31)
CREATININE: 0.91 mg/dL (ref 0.60–1.35)
GFR, Est Non African American: >89 mL/min (ref >=60)
Glucose, Bld: 89 mg/dL (ref 65–99)
Potassium: 4.3 mmol/L (ref 3.5–5.3)
SODIUM: 141 mmol/L (ref 135–146)

## 2015-03-31 LAB — LIPID PANEL
Cholesterol: 169 mg/dL (ref 125–200)
HDL: 24 mg/dL — AB (ref >=40)
LDL CALC: 97 mg/dL (ref <130)
TRIGLYCERIDES: 239 mg/dL — AB (ref <150)
Total CHOL/HDL Ratio: 7 Ratio — ABNORMAL HIGH (ref <=5.0)
VLDL: 48 mg/dL — AB (ref <30)

## 2015-03-31 LAB — HIV ANTIBODY (ROUTINE TESTING W REFLEX): HIV: NONREACTIVE

## 2015-04-01 LAB — HEMOGLOBIN A1C
HEMOGLOBIN A1C: 5.9 % — AB (ref <5.7)
Mean Plasma Glucose: 123 mg/dL — ABNORMAL HIGH (ref <117)

## 2015-04-25 NOTE — Addendum Note (Signed)
Addended by: Denman George B on: 04/25/2015 04:24 PM   Modules accepted: Orders

## 2015-05-24 ENCOUNTER — Other Ambulatory Visit: Payer: Self-pay | Admitting: Family Medicine

## 2015-09-12 ENCOUNTER — Ambulatory Visit: Payer: BLUE CROSS/BLUE SHIELD | Admitting: Family Medicine

## 2015-09-26 ENCOUNTER — Ambulatory Visit (INDEPENDENT_AMBULATORY_CARE_PROVIDER_SITE_OTHER): Payer: BLUE CROSS/BLUE SHIELD | Admitting: Family Medicine

## 2015-09-26 ENCOUNTER — Encounter: Payer: Self-pay | Admitting: Family Medicine

## 2015-09-26 VITALS — BP 140/80 | HR 86 | Resp 18 | Ht 71.0 in | Wt 218.0 lb

## 2015-09-26 DIAGNOSIS — B353 Tinea pedis: Secondary | ICD-10-CM | POA: Diagnosis not present

## 2015-09-26 DIAGNOSIS — E785 Hyperlipidemia, unspecified: Secondary | ICD-10-CM

## 2015-09-26 DIAGNOSIS — I10 Essential (primary) hypertension: Secondary | ICD-10-CM

## 2015-09-26 DIAGNOSIS — Z125 Encounter for screening for malignant neoplasm of prostate: Secondary | ICD-10-CM

## 2015-09-26 DIAGNOSIS — G478 Other sleep disorders: Secondary | ICD-10-CM

## 2015-09-26 DIAGNOSIS — E669 Obesity, unspecified: Secondary | ICD-10-CM

## 2015-09-26 DIAGNOSIS — E8881 Metabolic syndrome: Secondary | ICD-10-CM

## 2015-09-26 DIAGNOSIS — F17209 Nicotine dependence, unspecified, with unspecified nicotine-induced disorders: Secondary | ICD-10-CM | POA: Diagnosis not present

## 2015-09-26 DIAGNOSIS — R7303 Prediabetes: Secondary | ICD-10-CM

## 2015-09-26 DIAGNOSIS — G4730 Sleep apnea, unspecified: Secondary | ICD-10-CM | POA: Insufficient documentation

## 2015-09-26 DIAGNOSIS — J3089 Other allergic rhinitis: Secondary | ICD-10-CM

## 2015-09-26 MED ORDER — TERBINAFINE HCL 250 MG PO TABS: 250.0000 mg | ORAL_TABLET | Freq: Every day | ORAL | Status: DC

## 2015-09-26 MED ORDER — FLUTICASONE PROPIONATE 50 MCG/ACT NA SUSP: 2.0000 | Freq: Every day | NASAL | Status: DC

## 2015-09-26 MED ORDER — MONTELUKAST SODIUM 10 MG PO TABS: 10.0000 mg | ORAL_TABLET | Freq: Every day | ORAL | Status: DC

## 2015-09-26 NOTE — Progress Notes (Signed)
Subjective:    Patient ID: Evan Black, male    DOB: 1970-08-14, 45 y.o.   MRN: [id number]  HPI   Evan Black     MRN: [id number]      DOB: [birth year]   HPI Mr. Voet is here for follow up and re-evaluation of chronic medical conditions, medication management and review of any available recent lab and radiology data.  Preventive health is updated, specifically  Cancer screening and Immunization.    The PT denies any adverse reactions to current medications since the last visit.  C/o daily headache, excessive fatigue and snoring. C/o nasal congestion excess clear nasal drainage, stuffs his nostril with tissue at bedtime "so he can breathe" C/o excess flaking and itching between toes which are moist  ROS Denies recent fever or chills. Denies sinus pressure, nasal congestion, ear pain or sore throat. Denies chest congestion, productive cough or wheezing. Denies chest pains, palpitations and leg swelling Denies abdominal pain, nausea, vomiting,diarrhea or constipation.   Denies dysuria, frequency, hesitancy or incontinence. Denies joint pain, swelling and limitation in mobility. Denies , seizures, numbness, or tingling. Denies depression, anxiety or insomnia.    PE  BP 140/80 mmHg  Pulse 86  Resp 18  Ht 5\' 11"  (1.803 m)  Wt 218 lb (98.884 kg)  BMI 30.42 kg/m2  SpO2 98%  Patient alert and oriented and in no cardiopulmonary distress.  HEENT: No facial asymmetry, EOMI,   oropharynx pink and moist.  Neck supple no JVD, no mass. Frontal sinus tenderness, erythema and edema of nasal mucosa.TM clear  Chest: Clear to auscultation bilaterally.Decreased though adequate air entry  CVS: S1, S2 no murmurs, no S3.Regular rate.  ABD: Soft non tender.   Ext: No edema  Skin: severe tinea pedis between all toes with skin breakdown, no bacterial superinfection noted, no purulent drainage  Psych: Good eye contact, normal affect. Memory intact not anxious or depressed  appearing.  CNS: CN 2-12 intact, power,  normal throughout.no focal deficits noted.   Assessment & Plan  Essential hypertension Uncontrolled No med change DASH diet and commitment to daily physical activity for a minimum of 30 minutes discussed and encouraged, as a part of hypertension management. The importance of attaining a healthy weight is also discussed.  BP/Weight 09/26/2015 03/12/2015 05/29/2014 08/01/2013 04/18/2013 99991111 [id number]  Systolic BP XX123456 Q000111Q 123XX123 A999333 Q000111Q Q000111Q 0000000  Diastolic BP 80 80 82 90 82 80 86  Wt. (Lbs) 218 208.12 209 211.08 210.08 222 217.12  BMI 30.42 29.04 29.16 29.45 29.31 30.98 30.3        Allergic rhinitis Uncontrolled, uses tissue in 1 nostril daily to help him breathe Start daily flonase and singulair  Nicotine dependence Patient counseled for approximately 5 minutes regarding the health risks of ongoing nicotine use, specifically all types of cancer, heart disease, stroke and respiratory failure. The options available for help with cessation ,the behavioral changes to assist the process, and the option to either gradully reduce usage  Or abruptly stop.is also discussed. Pt is also encouraged to set specific goals in number of cigarettes used daily, as well as to set a quit date.  Number of cigarettes/cigars currently smoking daily: 3   Tinea pedis Severe affecting both feet between all digits Oral med x 1 month and topical spray Pt ed also done  Obesity (BMI 30.0-34.9) Deteriorated. Patient re-educated about  the importance of commitment to a  minimum of 150 minutes of exercise per week.  The importance of healthy food choices  with portion control discussed. Encouraged to start a food diary, count calories and to consider  joining a support group. Sample diet sheets offered. Goals set by the patient for the next several months.   Weight /BMI 09/26/2015 03/12/2015 05/29/2014  WEIGHT 218 lb 208 lb 1.9 oz 209 lb  HEIGHT 5\' 11"  5\' 11"  5'  11"  BMI 30.42 kg/m2 29.04 kg/m2 29.16 kg/m2    Current exercise per week 60 minutes.   Prediabetes Patient educated about the importance of limiting  Carbohydrate intake , the need to commit to daily physical activity for a minimum of 30 minutes , and to commit weight loss. The fact that changes in all these areas will reduce or eliminate all together the development of diabetes is stressed.  Updated lab needed at/ before next visit.   Diabetic Labs Latest Ref Rng 03/31/2015 06/01/2014 01/11/2013 04/01/2011 02/28/2010  HbA1c <5.7 % 5.9(H) 6.1(H) 5.7(H) 5.9(H) 6.3(H)  Chol 125 - 200 mg/dL 169 184 184 204(H) 184  HDL >=40 mg/dL 24(L) 22(L) 31(L) 28(L) 26(L)  Calc LDL <130 mg/dL 97 NOT CALC 107(H) 116(H) See Comment mg/dL  Triglycerides <150 mg/dL 239(H) 435(H) 228(H) 298(H) 409(H)  Creatinine 0.60 - 1.35 mg/dL 0.91 0.95 0.81 0.94 1.00   BP/Weight 09/26/2015 03/12/2015 05/29/2014 08/01/2013 04/18/2013 99991111 A999333  Systolic BP XX123456 Q000111Q 123XX123 A999333 Q000111Q Q000111Q 0000000  Diastolic BP 80 80 82 90 82 80 86  Wt. (Lbs) 218 208.12 209 211.08 210.08 222 217.12  BMI 30.42 29.04 29.16 29.45 29.31 30.98 30.3   No flowsheet data found.     Metabolic syndrome X The increased risk of cardiovascular disease associated with this diagnosis, and the need to consistently work on lifestyle to change this is discussed. Following  a  heart healthy diet ,commitment to 30 minutes of exercise at least 5 days per week, as well as control of blood sugar and cholesterol , and achieving a healthy weight are all the areas to be addressed .   Hyperlipidemia with target LDL less than 100 Hyperlipidemia:Low fat diet discussed and encouraged.   Lipid Panel  Lab Results  Component Value Date   CHOL 169 03/31/2015   HDL 24* 03/31/2015   LDLCALC 97 03/31/2015   LDLDIRECT 86 06/01/2014   TRIG 239* 03/31/2015   CHOLHDL 7.0* 03/31/2015      Updated lab needed at/ before next visit.   Sleep disorder  breathing Snoring, fatigue , daily headaches, needs sleep study , also thick neck, hypertension and obesity. Referred for eval and significance of disease and follow through discussed      Review of Systems     Objective:   Physical Exam        Assessment & Plan:

## 2015-09-26 NOTE — Assessment & Plan Note (Signed)
Uncontrolled No med change DASH diet and commitment to daily physical activity for a minimum of 30 minutes discussed and encouraged, as a part of hypertension management. The importance of attaining a healthy weight is also discussed.  BP/Weight 09/26/2015 03/12/2015 05/29/2014 08/01/2013 04/18/2013 99991111 A999333  Systolic BP XX123456 Q000111Q 123XX123 A999333 Q000111Q Q000111Q 0000000  Diastolic BP 80 80 82 90 82 80 86  Wt. (Lbs) 218 208.12 209 211.08 210.08 222 217.12  BMI 30.42 29.04 29.16 29.45 29.31 30.98 30.3

## 2015-09-26 NOTE — Assessment & Plan Note (Signed)
Hyperlipidemia:Low fat diet discussed and encouraged.   Lipid Panel  Lab Results  Component Value Date   CHOL 169 03/31/2015   HDL 24* 03/31/2015   LDLCALC 97 03/31/2015   LDLDIRECT 86 06/01/2014   TRIG 239* 03/31/2015   CHOLHDL 7.0* 03/31/2015      Updated lab needed at/ before next visit.

## 2015-09-26 NOTE — Assessment & Plan Note (Signed)
The increased risk of cardiovascular disease associated with this diagnosis, and the need to consistently work on lifestyle to change this is discussed. Following  a  heart healthy diet ,commitment to 30 minutes of exercise at least 5 days per week, as well as control of blood sugar and cholesterol , and achieving a healthy weight are all the areas to be addressed .  

## 2015-09-26 NOTE — Assessment & Plan Note (Addendum)
Uncontrolled, uses tissue in 1 nostril daily to help him breathe Start daily flonase and singulair

## 2015-09-26 NOTE — Assessment & Plan Note (Signed)
Severe affecting both feet between all digits Oral med x 1 month and topical spray Pt ed also done

## 2015-09-26 NOTE — Assessment & Plan Note (Signed)

## 2015-09-26 NOTE — Assessment & Plan Note (Signed)
Deteriorated. Patient re-educated about  the importance of commitment to a  minimum of 150 minutes of exercise per week.  The importance of healthy food choices with portion control discussed. Encouraged to start a food diary, count calories and to consider  joining a support group. Sample diet sheets offered. Goals set by the patient for the next several months.   Weight /BMI 09/26/2015 03/12/2015 05/29/2014  WEIGHT 218 lb 208 lb 1.9 oz 209 lb  HEIGHT 5\' 11"  5\' 11"  5\' 11"   BMI 30.42 kg/m2 29.04 kg/m2 29.16 kg/m2    Current exercise per week 60 minutes.

## 2015-09-26 NOTE — Assessment & Plan Note (Signed)
Snoring, fatigue , daily headaches, needs sleep study , also thick neck, hypertension and obesity. Referred for eval and significance of disease and follow through discussed

## 2015-09-26 NOTE — Assessment & Plan Note (Signed)
Patient educated about the importance of limiting  Carbohydrate intake , the need to commit to daily physical activity for a minimum of 30 minutes , and to commit weight loss. The fact that changes in all these areas will reduce or eliminate all together the development of diabetes is stressed.  Updated lab needed at/ before next visit.   Diabetic Labs Latest Ref Rng 03/31/2015 06/01/2014 01/11/2013 04/01/2011 02/28/2010  HbA1c <5.7 % 5.9(H) 6.1(H) 5.7(H) 5.9(H) 6.3(H)  Chol 125 - 200 mg/dL 169 184 184 204(H) 184  HDL >=40 mg/dL 24(L) 22(L) 31(L) 28(L) 26(L)  Calc LDL <130 mg/dL 97 NOT CALC 107(H) 116(H) See Comment mg/dL  Triglycerides <150 mg/dL 239(H) 435(H) 228(H) 298(H) 409(H)  Creatinine 0.60 - 1.35 mg/dL 0.91 0.95 0.81 0.94 1.00   BP/Weight 09/26/2015 03/12/2015 05/29/2014 08/01/2013 04/18/2013 99991111 A999333  Systolic BP XX123456 Q000111Q 123XX123 A999333 Q000111Q Q000111Q 0000000  Diastolic BP 80 80 82 90 82 80 86  Wt. (Lbs) 218 208.12 209 211.08 210.08 222 217.12  BMI 30.42 29.04 29.16 29.45 29.31 30.98 30.3   No flowsheet data found.

## 2015-09-26 NOTE — Patient Instructions (Signed)
F/u in 4 month, call if you need me sooner  Need labs first week in May PLEASE  Terbinafine tablets one daily for 1 month for fungal infection of feet, also buy oTC antifungal spray, clotrimazole use twice daily Use Dr Zoe Lan foot powder in shoes to keep feet dry, and change socks daily Dry between toes carefylly with paper towel  Work on 10 pound weight loss in next 4 month  Pls set and stay on a quit date fopr smoking,   You are referred to Dr Merlene Laughter to be tested for sleep apnea  Thank you  for choosing  [facility]. We consider it a privelige to serve you.  Delivering excellent health care in a caring and  compassionate way is our goal.  Partnering with you,  so that together we can achieve this goal is our strategy.

## 2015-10-15 ENCOUNTER — Other Ambulatory Visit: Payer: Self-pay | Admitting: Family Medicine

## 2015-10-24 ENCOUNTER — Other Ambulatory Visit (HOSPITAL_COMMUNITY): Payer: Self-pay | Admitting: Respiratory Therapy

## 2015-10-24 DIAGNOSIS — R5383 Other fatigue: Secondary | ICD-10-CM

## 2015-10-24 DIAGNOSIS — I10 Essential (primary) hypertension: Secondary | ICD-10-CM

## 2015-10-24 DIAGNOSIS — G4733 Obstructive sleep apnea (adult) (pediatric): Secondary | ICD-10-CM

## 2016-02-18 ENCOUNTER — Telehealth: Payer: Self-pay | Admitting: Family Medicine

## 2016-02-18 ENCOUNTER — Other Ambulatory Visit: Payer: Self-pay | Admitting: Family Medicine

## 2016-02-18 ENCOUNTER — Other Ambulatory Visit: Payer: Self-pay

## 2016-02-18 MED ORDER — METHOCARBAMOL 500 MG PO TABS: 500.0000 mg | ORAL_TABLET | Freq: Three times a day (TID) | ORAL | 0 refills | Status: DC | PRN

## 2016-02-18 NOTE — Telephone Encounter (Signed)
Evan Black has an appointment for his back on 03/05/16 wife Jackelyn Poling is asking if Dr. Moshe Cipro would call him in some Robaxin until he can be seen, please advise?

## 2016-02-18 NOTE — Telephone Encounter (Signed)
Patient aware.

## 2016-02-18 NOTE — Telephone Encounter (Signed)
Sent in pls let them know

## 2016-02-18 NOTE — Telephone Encounter (Signed)
Please advise 

## 2016-02-19 ENCOUNTER — Encounter: Payer: Self-pay | Admitting: Family Medicine

## 2016-02-19 ENCOUNTER — Ambulatory Visit (INDEPENDENT_AMBULATORY_CARE_PROVIDER_SITE_OTHER): Payer: BLUE CROSS/BLUE SHIELD | Admitting: Family Medicine

## 2016-02-19 ENCOUNTER — Ambulatory Visit: Payer: BLUE CROSS/BLUE SHIELD | Admitting: Family Medicine

## 2016-02-19 VITALS — BP 154/92 | HR 86 | Resp 18 | Ht 71.0 in | Wt 214.1 lb

## 2016-02-19 DIAGNOSIS — F17208 Nicotine dependence, unspecified, with other nicotine-induced disorders: Secondary | ICD-10-CM | POA: Diagnosis not present

## 2016-02-19 DIAGNOSIS — M546 Pain in thoracic spine: Secondary | ICD-10-CM

## 2016-02-19 DIAGNOSIS — Z125 Encounter for screening for malignant neoplasm of prostate: Secondary | ICD-10-CM | POA: Diagnosis not present

## 2016-02-19 DIAGNOSIS — Z23 Encounter for immunization: Secondary | ICD-10-CM

## 2016-02-19 DIAGNOSIS — E785 Hyperlipidemia, unspecified: Secondary | ICD-10-CM

## 2016-02-19 DIAGNOSIS — I10 Essential (primary) hypertension: Secondary | ICD-10-CM

## 2016-02-19 DIAGNOSIS — R7303 Prediabetes: Secondary | ICD-10-CM

## 2016-02-19 DIAGNOSIS — E669 Obesity, unspecified: Secondary | ICD-10-CM

## 2016-02-19 DIAGNOSIS — E66811 Obesity, class 1: Secondary | ICD-10-CM

## 2016-02-19 MED ORDER — PREDNISONE 5 MG (21) PO TBPK: 5.0000 mg | ORAL_TABLET | ORAL | 0 refills | Status: DC

## 2016-02-19 MED ORDER — IBUPROFEN 800 MG PO TABS: 800.0000 mg | ORAL_TABLET | Freq: Three times a day (TID) | ORAL | 0 refills | Status: DC | PRN

## 2016-02-19 MED ORDER — CYCLOBENZAPRINE HCL 10 MG PO TABS: ORAL_TABLET | ORAL | 1 refills | Status: DC

## 2016-02-19 MED ORDER — KETOROLAC TROMETHAMINE 60 MG/2ML IM SOLN
60.0000 mg | Freq: Once | INTRAMUSCULAR | Status: AC
  Administered 2016-02-19: 60 mg via INTRAMUSCULAR

## 2016-02-19 MED ORDER — TRAMADOL HCL 50 MG PO TABS: 50.0000 mg | ORAL_TABLET | Freq: Three times a day (TID) | ORAL | 0 refills | Status: DC | PRN

## 2016-02-19 MED ORDER — METHYLPREDNISOLONE ACETATE 80 MG/ML IJ SUSP
80.0000 mg | Freq: Once | INTRAMUSCULAR | Status: AC
  Administered 2016-02-19: 80 mg via INTRAMUSCULAR

## 2016-02-19 NOTE — Patient Instructions (Addendum)
Physical exam 2nd week in October, call if you need me sooner  Toradol and depo medrol in office for back pain, short course of anti inflammatories and prednisone, flexeril and tramadol sent  , bP is high, increase amlodipine to 10 mg daily, may take TWO 5 mg together daily till done  It is important that you exercise regularly at least 30 minutes 5 times a week. If you develop chest pain, have severe difficulty breathing, or feel very tired, stop exercising immediately and seek medical attention   EAT mainly fresh/ frozen fruit and vegetable, and work on weight loss  Work excuse for this pm  Need  Fasting labs 1 week before next visit   Flu vaccine today

## 2016-02-19 NOTE — Assessment & Plan Note (Signed)
,   inc amlodipine and  DASH diet and commitment to daily physical activity for a minimum of 30 minutes discussed and encouraged, as a part of hypertension management. The importance of attaining a healthy weight is also discussed.  BP/Weight 02/19/2016 09/26/2015 03/12/2015 05/29/2014 08/01/2013 04/18/2013 99991111  Systolic BP 123456 XX123456 Q000111Q 123XX123 A999333 Q000111Q Q000111Q  Diastolic BP 92 80 80 82 90 82 80  Wt. (Lbs) 214.12 218 208.12 209 211.08 210.08 222  BMI 29.86 30.42 29.04 29.16 29.45 29.31 30.98

## 2016-02-19 NOTE — Assessment & Plan Note (Signed)
Uncontrolled.Toradol and depo medrol administered IM in the office , to be followed by a short course of oral prednisone and NSAIDS.  

## 2016-02-19 NOTE — Assessment & Plan Note (Signed)
Patient educated about the importance of limiting  Carbohydrate intake , the need to commit to daily physical activity for a minimum of 30 minutes , and to commit weight loss. The fact that changes in all these areas will reduce or eliminate all together the development of diabetes is stressed.   Diabetic Labs Latest Ref Rng & Units 03/31/2015 06/01/2014 01/11/2013 04/01/2011 02/28/2010  HbA1c <5.7 % 5.9(H) 6.1(H) 5.7(H) 5.9(H) 6.3(H)  Chol 125 - 200 mg/dL 169 184 184 204(H) 184  HDL >=40 mg/dL 24(L) 22(L) 31(L) 28(L) 26(L)  Calc LDL <130 mg/dL 97 NOT CALC 107(H) 116(H) See Comment mg/dL  Triglycerides <150 mg/dL 239(H) 435(H) 228(H) 298(H) 409(H)  Creatinine 0.60 - 1.35 mg/dL 0.91 0.95 0.81 0.94 1.00   BP/Weight 02/19/2016 09/26/2015 03/12/2015 05/29/2014 08/01/2013 04/18/2013 99991111  Systolic BP 123456 XX123456 Q000111Q 123XX123 A999333 Q000111Q Q000111Q  Diastolic BP 92 80 80 82 90 82 80  Wt. (Lbs) 214.12 218 208.12 209 211.08 210.08 222  BMI 29.86 30.42 29.04 29.16 29.45 29.31 30.98   No flowsheet data found. Updated lab needed at/ before next visit.

## 2016-02-19 NOTE — Assessment & Plan Note (Signed)
Hyperlipidemia:Low fat diet discussed and encouraged.   Lipid Panel  Lab Results  Component Value Date   CHOL 169 03/31/2015   HDL 24 (L) 03/31/2015   LDLCALC 97 03/31/2015   LDLDIRECT 86 06/01/2014   TRIG 239 (H) 03/31/2015   CHOLHDL 7.0 (H) 03/31/2015     Updated lab needed at/ before next visit.

## 2016-02-19 NOTE — Assessment & Plan Note (Signed)
Patient counseled for approximately 5 minutes regarding the health risks of ongoing nicotine use, specifically all types of cancer, heart disease, stroke and respiratory failure. The options available for help with cessation ,the behavioral changes to assist the process, and the option to either gradully reduce usage  Or abruptly stop.is also discussed. Pt is also encouraged to set specific goals in number of cigarettes used daily, as well as to set a quit date. Current 5 to 10

## 2016-02-19 NOTE — Assessment & Plan Note (Signed)
improved. Patient re-educated about  the importance of commitment to a  minimum of 150 minutes of exercise per week.  The importance of healthy food choices with portion control discussed. Encouraged to start a food diary, count calories and to consider  joining a support group. Sample diet sheets offered. Goals set by the patient for the next several months.   Weight /BMI 02/19/2016 09/26/2015 03/12/2015  WEIGHT 214 lb 1.9 oz 218 lb 208 lb 1.9 oz  HEIGHT 5\' 11"  5\' 11"  5\' 11"   BMI 29.86 kg/m2 30.42 kg/m2 29.04 kg/m2

## 2016-02-19 NOTE — Progress Notes (Signed)
Assessment:     Plan:

## 2016-02-20 NOTE — Progress Notes (Signed)
Evan Black     MRN: [id number]      DOB: [birth year]   HPI Evan Black is here for follow up and re-evaluation of chronic medical conditions, medication management and review of any available recent lab and radiology data.  Preventive health is updated, specifically  Cancer screening and Immunization.   1 week h/o worsening mid and low  back pain disturbing sleep thinks related to mattress Has cut down smoking , and working on alcohol also ROS Denies recent fever or chills. Denies sinus pressure, nasal congestion, ear pain or sore throat. Denies chest congestion, productive cough or wheezing. Denies chest pains, palpitations and leg swelling Denies abdominal pain, nausea, vomiting,diarrhea or constipation.   Denies dysuria, frequency, hesitancy or incontinence.  Denies headaches, seizures, numbness, or tingling. Denies depression, anxiety or insomnia. Denies skin break down or rash.   PE  BP (!) 154/92   Pulse 86   Resp 18   Ht 5\' 11"  (1.803 m)   Wt 214 lb 1.9 oz (97.1 kg)   SpO2 97%   BMI 29.86 kg/m   Patient alert and oriented and in no cardiopulmonary distress.  HEENT: No facial asymmetry, EOMI,   oropharynx pink and moist.  Neck supple no JVD, no mass.  Chest: Clear to auscultation bilaterally.  CVS: S1, S2 no murmurs, no S3.Regular rate.  ABD: Soft non tender.   Ext: No edema  MS: Decreased  ROM spine, adequate in shoulders, hips and knees.  Skin: Intact, no ulcerations or rash noted.  Psych: Good eye contact, normal affect. Memory intact not anxious or depressed appearing.  CNS: CN 2-12 intact, power,  normal throughout.no focal deficits noted.   Assessment & Plan  Essential hypertension , inc amlodipine and  DASH diet and commitment to daily physical activity for a minimum of 30 minutes discussed and encouraged, as a part of hypertension management. The importance of attaining a healthy weight is also discussed.  BP/Weight 02/19/2016 09/26/2015  03/12/2015 05/29/2014 08/01/2013 04/18/2013 99991111  Systolic BP 123456 XX123456 Q000111Q 123XX123 A999333 Q000111Q Q000111Q  Diastolic BP 92 80 80 82 90 82 80  Wt. (Lbs) 214.12 218 208.12 209 211.08 210.08 222  BMI 29.86 30.42 29.04 29.16 29.45 29.31 30.98       Nicotine dependence Patient counseled for approximately 5 minutes regarding the health risks of ongoing nicotine use, specifically all types of cancer, heart disease, stroke and respiratory failure. The options available for help with cessation ,the behavioral changes to assist the process, and the option to either gradully reduce usage  Or abruptly stop.is also discussed. Pt is also encouraged to set specific goals in number of cigarettes used daily, as well as to set a quit date. Current 5 to 10    Obesity (BMI 30.0-34.9) improved. Patient re-educated about  the importance of commitment to a  minimum of 150 minutes of exercise per week.  The importance of healthy food choices with portion control discussed. Encouraged to start a food diary, count calories and to consider  joining a support group. Sample diet sheets offered. Goals set by the patient for the next several months.   Weight /BMI 02/19/2016 09/26/2015 03/12/2015  WEIGHT 214 lb 1.9 oz 218 lb 208 lb 1.9 oz  HEIGHT 5\' 11"  5\' 11"  5\' 11"   BMI 29.86 kg/m2 30.42 kg/m2 29.04 kg/m2      Prediabetes Patient educated about the importance of limiting  Carbohydrate intake , the need to commit to daily physical activity for a minimum of  30 minutes , and to commit weight loss. The fact that changes in all these areas will reduce or eliminate all together the development of diabetes is stressed.   Diabetic Labs Latest Ref Rng & Units 03/31/2015 06/01/2014 01/11/2013 04/01/2011 02/28/2010  HbA1c <5.7 % 5.9(H) 6.1(H) 5.7(H) 5.9(H) 6.3(H)  Chol 125 - 200 mg/dL 169 184 184 204(H) 184  HDL >=40 mg/dL 24(L) 22(L) 31(L) 28(L) 26(L)  Calc LDL <130 mg/dL 97 NOT CALC 107(H) 116(H) See Comment mg/dL  Triglycerides  <150 mg/dL 239(H) 435(H) 228(H) 298(H) 409(H)  Creatinine 0.60 - 1.35 mg/dL 0.91 0.95 0.81 0.94 1.00   BP/Weight 02/19/2016 09/26/2015 03/12/2015 05/29/2014 08/01/2013 04/18/2013 99991111  Systolic BP 123456 XX123456 Q000111Q 123XX123 A999333 Q000111Q Q000111Q  Diastolic BP 92 80 80 82 90 82 80  Wt. (Lbs) 214.12 218 208.12 209 211.08 210.08 222  BMI 29.86 30.42 29.04 29.16 29.45 29.31 30.98   No flowsheet data found. Updated lab needed at/ before next visit.   Hyperlipidemia with target LDL less than 100 Hyperlipidemia:Low fat diet discussed and encouraged.   Lipid Panel  Lab Results  Component Value Date   CHOL 169 03/31/2015   HDL 24 (L) 03/31/2015   LDLCALC 97 03/31/2015   LDLDIRECT 86 06/01/2014   TRIG 239 (H) 03/31/2015   CHOLHDL 7.0 (H) 03/31/2015     Updated lab needed at/ before next visit.   Back pain Uncontrolled.Toradol and depo medrol administered IM in the office , to be followed by a short course of oral prednisone and NSAIDS.

## 2016-03-05 ENCOUNTER — Ambulatory Visit: Payer: BLUE CROSS/BLUE SHIELD | Admitting: Family Medicine

## 2016-03-14 LAB — LIPID PANEL
CHOLESTEROL: 161 mg/dL (ref 125–200)
HDL: 24 mg/dL — AB (ref >=40)
LDL Cholesterol: 85 mg/dL (ref <130)
TRIGLYCERIDES: 259 mg/dL — AB (ref <150)
Total CHOL/HDL Ratio: 6.7 Ratio — ABNORMAL HIGH (ref <=5.0)
VLDL: 52 mg/dL — AB (ref <30)

## 2016-03-14 LAB — CBC
HCT: 38.4 % — ABNORMAL LOW (ref 38.5–50.0)
HEMOGLOBIN: 13.3 g/dL (ref 13.2–17.1)
MCH: 29.9 pg (ref 27.0–33.0)
MCHC: 34.6 g/dL (ref 32.0–36.0)
MCV: 86.3 fL (ref 80.0–100.0)
MPV: 10 fL (ref 7.5–12.5)
Platelets: 304 10*3/uL (ref 140–400)
RBC: 4.45 MIL/uL (ref 4.20–5.80)
RDW: 13.7 % (ref 11.0–15.0)
WBC: 8 10*3/uL (ref 3.8–10.8)

## 2016-03-14 LAB — COMPREHENSIVE METABOLIC PANEL
ALK PHOS: 46 U/L (ref 40–115)
ALT: 35 U/L (ref 9–46)
AST: 25 U/L (ref 10–40)
Albumin: 3.8 g/dL (ref 3.6–5.1)
BUN: 13 mg/dL (ref 7–25)
CALCIUM: 9 mg/dL (ref 8.6–10.3)
CO2: 29 mmol/L (ref 20–31)
Chloride: 103 mmol/L (ref 98–110)
Creat: 0.95 mg/dL (ref 0.60–1.35)
GLUCOSE: 88 mg/dL (ref 65–99)
POTASSIUM: 3.8 mmol/L (ref 3.5–5.3)
Sodium: 140 mmol/L (ref 135–146)
TOTAL PROTEIN: 6.3 g/dL (ref 6.1–8.1)
Total Bilirubin: 0.3 mg/dL (ref 0.2–1.2)

## 2016-03-14 LAB — HEMOGLOBIN A1C
HEMOGLOBIN A1C: 5.7 % — AB (ref <5.7)
Mean Plasma Glucose: 117 mg/dL

## 2016-03-14 LAB — TSH: TSH: 3.3 mIU/L (ref 0.40–4.50)

## 2016-03-14 LAB — PSA: PSA: 0.3 ng/mL (ref <=4.0)

## 2016-03-17 ENCOUNTER — Ambulatory Visit (INDEPENDENT_AMBULATORY_CARE_PROVIDER_SITE_OTHER): Payer: BLUE CROSS/BLUE SHIELD | Admitting: Family Medicine

## 2016-03-17 ENCOUNTER — Encounter: Payer: BLUE CROSS/BLUE SHIELD | Admitting: Family Medicine

## 2016-03-17 ENCOUNTER — Encounter: Payer: Self-pay | Admitting: Family Medicine

## 2016-03-17 VITALS — BP 120/82 | HR 86 | Ht 69.5 in | Wt 211.0 lb

## 2016-03-17 DIAGNOSIS — F17218 Nicotine dependence, cigarettes, with other nicotine-induced disorders: Secondary | ICD-10-CM

## 2016-03-17 DIAGNOSIS — Z0000 Encounter for general adult medical examination without abnormal findings: Secondary | ICD-10-CM | POA: Diagnosis not present

## 2016-03-17 DIAGNOSIS — E8881 Metabolic syndrome: Secondary | ICD-10-CM

## 2016-03-17 DIAGNOSIS — R7303 Prediabetes: Secondary | ICD-10-CM | POA: Diagnosis not present

## 2016-03-17 DIAGNOSIS — E782 Mixed hyperlipidemia: Secondary | ICD-10-CM | POA: Diagnosis not present

## 2016-03-17 DIAGNOSIS — E66811 Obesity, class 1: Secondary | ICD-10-CM

## 2016-03-17 DIAGNOSIS — I10 Essential (primary) hypertension: Secondary | ICD-10-CM

## 2016-03-17 DIAGNOSIS — Z1211 Encounter for screening for malignant neoplasm of colon: Secondary | ICD-10-CM

## 2016-03-17 DIAGNOSIS — E669 Obesity, unspecified: Secondary | ICD-10-CM

## 2016-03-17 LAB — POC HEMOCCULT BLD/STL (OFFICE/1-CARD/DIAGNOSTIC): Fecal Occult Blood, POC: NEGATIVE

## 2016-03-17 MED ORDER — ATORVASTATIN CALCIUM 10 MG PO TABS: 10.0000 mg | ORAL_TABLET | Freq: Every day | ORAL | 4 refills | Status: DC

## 2016-03-17 NOTE — Assessment & Plan Note (Signed)

## 2016-03-17 NOTE — Assessment & Plan Note (Signed)

## 2016-03-17 NOTE — Assessment & Plan Note (Signed)
Improved Patient educated about the importance of limiting  Carbohydrate intake , the need to commit to daily physical activity for a minimum of 30 minutes , and to commit weight loss. The fact that changes in all these areas will reduce or eliminate all together the development of diabetes is stressed.   Diabetic Labs Latest Ref Rng & Units 03/14/2016 03/31/2015 06/01/2014 01/11/2013 04/01/2011  HbA1c <5.7 % 5.7(H) 5.9(H) 6.1(H) 5.7(H) 5.9(H)  Chol 125 - 200 mg/dL 161 169 184 184 204(H)  HDL >=40 mg/dL 24(L) 24(L) 22(L) 31(L) 28(L)  Calc LDL <130 mg/dL 85 97 NOT CALC 107(H) 116(H)  Triglycerides <150 mg/dL 259(H) 239(H) 435(H) 228(H) 298(H)  Creatinine 0.60 - 1.35 mg/dL 0.95 0.91 0.95 0.81 0.94   BP/Weight 03/17/2016 02/19/2016 09/26/2015 03/12/2015 05/29/2014 08/01/2013 0000000  Systolic BP 123456 123456 XX123456 Q000111Q 123XX123 A999333 Q000111Q  Diastolic BP 82 92 80 80 82 90 82  Wt. (Lbs) 211 214.12 218 208.12 209 211.08 210.08  BMI 30.71 29.86 30.42 29.04 29.16 29.45 29.31   No flowsheet data found.

## 2016-03-17 NOTE — Patient Instructions (Addendum)
F/u in 4 month, call if you need me before  Good exam, except need to lower fatty foods, commit to daily exercise and start lipitor at night to lower heart disease risk  Blood sugar is improved  Fasting lipid, cmp and eGFr and hBA1c in 4 month   Need to sTOP smoking, latest by your birthday, better by end of this month, best today!     You Can Quit Smoking If you are ready to quit smoking or are thinking about it, congratulations! You have chosen to help yourself be healthier and live longer! There are lots of different ways to quit smoking. Nicotine gum, nicotine patches, a nicotine inhaler, or nicotine nasal spray can help with physical craving. Hypnosis, support groups, and medicines help break the habit of smoking. TIPS TO GET OFF AND STAY OFF CIGARETTES  Learn to predict your moods. Do not let a bad situation be your excuse to have a cigarette. Some situations in your life might tempt you to have a cigarette.  Ask friends and co-workers not to smoke around you.  Make your home smoke-free.  Never have "just one" cigarette. It leads to wanting another and another. Remind yourself of your decision to quit.  On a card, make a list of your reasons for not smoking. Read it at least the same number of times a day as you have a cigarette. Tell yourself everyday, "I do not want to smoke. I choose not to smoke."  Ask someone at home or work to help you with your plan to quit smoking.  Have something planned after you eat or have a cup of coffee. Take a walk or get other exercise to perk you up. This will help to keep you from overeating.  Try a relaxation exercise to calm you down and decrease your stress. Remember, you may be tense and nervous the first two weeks after you quit. This will pass.  Find new activities to keep your hands busy. Play with a pen, coin, or rubber band. Doodle or draw things on paper.  Brush your teeth right after eating. This will help cut down the craving for  the taste of tobacco after meals. You can try mouthwash too.  Try gum, breath mints, or diet candy to keep something in your mouth. IF YOU SMOKE AND WANT TO QUIT:  Do not stock up on cigarettes. Never buy a carton. Wait until one pack is finished before you buy another.  Never carry cigarettes with you at work or at home.  Keep cigarettes as far away from you as possible. Leave them with someone else.  Never carry matches or a lighter with you.  Ask yourself, "Do I need this cigarette or is this just a reflex?"  Bet with someone that you can quit. Put cigarette money in a piggy bank every morning. If you smoke, you give up the money. If you do not smoke, by the end of the week, you keep the money.  Keep trying. It takes 21 days to change a habit!  Talk to your doctor about using medicines to help you quit. These include nicotine replacement gum, lozenges, or skin patches.   This information is not intended to replace advice given to you by your health care provider. Make sure you discuss any questions you have with your health care provider.   Document Released: 03/22/2009 Document Revised: 08/18/2011 Document Reviewed: 03/22/2009 Elsevier Interactive Patient Education [employer].

## 2016-03-17 NOTE — Assessment & Plan Note (Signed)
Hyperlipidemia:Low fat diet discussed and encouraged.   Lipid Panel  Lab Results  Component Value Date   CHOL 161 03/14/2016   HDL 24 (L) 03/14/2016   LDLCALC 85 03/14/2016   LDLDIRECT 86 06/01/2014   TRIG 259 (H) 03/14/2016   CHOLHDL 6.7 (H) 03/14/2016     Start lipitor. Updated lab needed at/ before next visit.

## 2016-03-17 NOTE — Progress Notes (Signed)
Evan Black     MRN: [id number]      DOB: 1970-08-20   HPI: Patient is in for annual physical exam. No other health concerns are expressed or addressed at the visit. Recent labs, if available are reviewed. Immunization is reviewed , and  updated if needed.    PE; Pleasant male, alert and oriented x 3, in no cardio-pulmonary distress. Afebrile. HEENT No facial trauma or asymetry. Sinuses non tender. EOMI, pupils equally reactive to light. External ears normal, tympanic membranes clear. Oropharynx moist, no exudate. Neck: supple, no adenopathy,JVD or thyromegaly.No bruits.  Chest: Clear to ascultation bilaterally.No crackles or wheezes. Non tender to palpation  Breast: No asymetry,no masses. No nipple discharge or inversion. No axillary or supraclavicular adenopathy  Cardiovascular system; Heart sounds normal,  S1 and  S2 ,no S3.  No murmur, or thrill. Apical beat not displaced Peripheral pulses normal.  Abdomen: Soft, non tender, no organomegaly or masses. No bruits. Bowel sounds normal. No guarding, tenderness or rebound.  Rectal:  Normal sphincter tone. No hemorrhoids or  masses. guaiac negative stool. Prostate smooth and firm    Musculoskeletal exam: Full ROM of spine, hips , shoulders and knees. No deformity ,swelling or crepitus noted. No muscle wasting or atrophy.   Neurologic: Cranial nerves 2 to 12 intact. Power, tone ,sensation and reflexes normal throughout. No disturbance in gait. No tremor.  Skin: Intact, no ulceration, erythema , scaling or rash noted. Pigmentation normal throughout  Psych; Normal mood and affect. Judgement and concentration normal   Assessment & Plan:  Annual physical exam Annual exam as documented. Counseling done  re healthy lifestyle involving commitment to 150 minutes exercise per week, heart healthy diet, and attaining healthy weight.The importance of adequate sleep also discussed. Regular seat belt use and  home safety, is also discussed. Changes in health habits are decided on by the patient with goals and time frames  set for achieving them. Immunization and cancer screening needs are specifically addressed at this visit.   Nicotine dependence Patient counseled for approximately 5 minutes regarding the health risks of ongoing nicotine use, specifically all types of cancer, heart disease, stroke and respiratory failure. The options available for help with cessation ,the behavioral changes to assist the process, and the option to either gradully reduce usage  Or abruptly stop.is also discussed. Pt is also encouraged to set specific goals in number of cigarettes used daily, as well as to set a quit date.     Prediabetes Improved Patient educated about the importance of limiting  Carbohydrate intake , the need to commit to daily physical activity for a minimum of 30 minutes , and to commit weight loss. The fact that changes in all these areas will reduce or eliminate all together the development of diabetes is stressed.   Diabetic Labs Latest Ref Rng & Units 03/14/2016 03/31/2015 06/01/2014 01/11/2013 04/01/2011  HbA1c <5.7 % 5.7(H) 5.9(H) 6.1(H) 5.7(H) 5.9(H)  Chol 125 - 200 mg/dL 161 169 184 184 204(H)  HDL >=40 mg/dL 24(L) 24(L) 22(L) 31(L) 28(L)  Calc LDL <130 mg/dL 85 97 NOT CALC 107(H) 116(H)  Triglycerides <150 mg/dL 259(H) 239(H) 435(H) 228(H) 298(H)  Creatinine 0.60 - 1.35 mg/dL 0.95 0.91 0.95 0.81 0.94   BP/Weight 03/17/2016 02/19/2016 09/26/2015 03/12/2015 05/29/2014 08/01/2013 0000000  Systolic BP 123456 123456 [id number] Q000111Q 123XX123 A999333 Q000111Q  Diastolic BP 82 92 80 80 82 90 82  Wt. (Lbs) 211 214.12 218 208.12 209 211.08 210.08  BMI 30.71 29.86 30.42 29.04 29.16  29.45 29.31   No flowsheet data found.    Obesity (BMI 30.0-34.9) Improved. Pt applauded on succesful weight loss through lifestyle change, and encouraged to continue same. Weight loss goal set for the next several months.   Mixed  dyslipidemia Hyperlipidemia:Low fat diet discussed and encouraged.   Lipid Panel  Lab Results  Component Value Date   CHOL 161 03/14/2016   HDL 24 (L) 03/14/2016   LDLCALC 85 03/14/2016   LDLDIRECT 86 06/01/2014   TRIG 259 (H) 03/14/2016   CHOLHDL 6.7 (H) 03/14/2016     Start lipitor. Updated lab needed at/ before next visit.

## 2016-03-17 NOTE — Assessment & Plan Note (Signed)
Improved. Pt applauded on succesful weight loss through lifestyle change, and encouraged to continue same. Weight loss goal set for the next several months.  

## 2016-04-18 ENCOUNTER — Telehealth: Payer: Self-pay | Admitting: Family Medicine

## 2016-04-18 NOTE — Telephone Encounter (Signed)
Evan Black is calling stating that Jeromes new Blood Pressure Rx was never called into the pharmacy where his medicine was never increased to 2 pills a day, please advise?

## 2016-04-19 ENCOUNTER — Other Ambulatory Visit: Payer: Self-pay

## 2016-04-19 MED ORDER — AMLODIPINE BESYLATE 5 MG PO TABS: 5.0000 mg | ORAL_TABLET | Freq: Every day | ORAL | 3 refills | Status: DC

## 2016-04-19 NOTE — Telephone Encounter (Signed)
Amlodipine 5 mg refilled. 

## 2016-04-22 ENCOUNTER — Telehealth: Payer: Self-pay

## 2016-04-22 ENCOUNTER — Telehealth: Payer: Self-pay | Admitting: Family Medicine

## 2016-04-22 NOTE — Telephone Encounter (Signed)
Looking back in sept , that  Was stated, not done, BUT in October his bP was normal on amlodipinre 5 mg dose. In Sept he was in pain, and I think he has also been working on lifety;le change to lower blood pressure, offer nurse bP re check since a bit of confusion please, if f good the correct dose is current one of amlodipine 5 mg daily Sorry about the confusion

## 2016-04-22 NOTE — Telephone Encounter (Signed)
Opened in error

## 2016-04-23 NOTE — Telephone Encounter (Signed)
Patient aware.

## 2016-05-02 ENCOUNTER — Other Ambulatory Visit: Payer: Self-pay | Admitting: Family Medicine

## 2016-06-25 ENCOUNTER — Other Ambulatory Visit: Payer: Self-pay | Admitting: Family Medicine

## 2016-07-21 ENCOUNTER — Encounter: Payer: Self-pay | Admitting: Family Medicine

## 2016-07-21 ENCOUNTER — Ambulatory Visit (INDEPENDENT_AMBULATORY_CARE_PROVIDER_SITE_OTHER): Payer: BLUE CROSS/BLUE SHIELD | Admitting: Family Medicine

## 2016-07-21 VITALS — BP 124/80 | HR 81 | Resp 15 | Ht 70.0 in | Wt 211.0 lb

## 2016-07-21 DIAGNOSIS — F17218 Nicotine dependence, cigarettes, with other nicotine-induced disorders: Secondary | ICD-10-CM

## 2016-07-21 DIAGNOSIS — I10 Essential (primary) hypertension: Secondary | ICD-10-CM

## 2016-07-21 DIAGNOSIS — R7303 Prediabetes: Secondary | ICD-10-CM | POA: Diagnosis not present

## 2016-07-21 DIAGNOSIS — E785 Hyperlipidemia, unspecified: Secondary | ICD-10-CM | POA: Diagnosis not present

## 2016-07-21 DIAGNOSIS — M5440 Lumbago with sciatica, unspecified side: Secondary | ICD-10-CM

## 2016-07-21 DIAGNOSIS — E669 Obesity, unspecified: Secondary | ICD-10-CM

## 2016-07-21 DIAGNOSIS — E8881 Metabolic syndrome: Secondary | ICD-10-CM

## 2016-07-21 MED ORDER — ATORVASTATIN CALCIUM 10 MG PO TABS: 10.0000 mg | ORAL_TABLET | Freq: Every day | ORAL | 1 refills | Status: DC

## 2016-07-21 NOTE — Assessment & Plan Note (Signed)
Controlled, no change in medication DASH diet and commitment to daily physical activity for a minimum of 30 minutes discussed and encouraged, as a part of hypertension management. The importance of attaining a healthy weight is also discussed.  BP/Weight 07/21/2016 03/17/2016 02/19/2016 09/26/2015 03/12/2015 05/29/2014 Q000111Q  Systolic BP A999333 123456 123456 [id number] Q000111Q [id number] A999333  Diastolic BP 80 82 92 80 80 82 90  Wt. (Lbs) 211 211 214.12 218 208.12 209 211.08  BMI 30.28 30.71 29.86 30.42 29.04 29.16 29.45

## 2016-07-21 NOTE — Progress Notes (Signed)
Evan Black     MRN: [id number]      DOB: 07-Oct-1970   HPI Mr. Vitulli is here for follow up and re-evaluation of chronic medical conditions, medication management and review of any available recent lab and radiology data.  Preventive health is updated, specifically  Cancer screening and Immunization.   Questions or concerns regarding consultations or procedures which the PT has had in the interim are  Addressed , good response to epidural injection. The PT denies any adverse reactions to current medications since the last visit.  There are no new concerns.  There are no specific complaints   ROS Denies recent fever or chills. Denies sinus pressure, nasal congestion, ear pain or sore throat. Denies chest congestion, productive cough or wheezing. Denies chest pains, palpitations and leg swelling Denies abdominal pain, nausea, vomiting,diarrhea or constipation.   Denies dysuria, frequency, hesitancy or incontinence. Denies joint pain, swelling and limitation in mobility. Denies headaches, seizures, numbness, or tingling. Denies depression, anxiety or insomnia. Denies skin break down or rash.   PE  BP 124/80   Pulse 81   Resp 15   Ht 5\' 10"  (1.778 m)   Wt 211 lb (95.7 kg)   SpO2 95%   BMI 30.28 kg/m   Patient alert and oriented and in no cardiopulmonary distress.  HEENT: No facial asymmetry, EOMI,   oropharynx pink and moist.  Neck supple no JVD, no mass.  Chest: Clear to auscultation bilaterally.  CVS: S1, S2 no murmurs, no S3.Regular rate.  ABD: Soft non tender.   Ext: No edema  MS: Adequate though reduced  ROM spine, shoulders, hips and knees.  Skin: Intact, no ulcerations or rash noted.  Psych: Good eye contact, normal affect. Memory intact not anxious or depressed appearing.  CNS: CN 2-12 intact, power,  normal throughout.no focal deficits noted.   Assessment & Plan  Essential hypertension Controlled, no change in medication DASH diet and commitment  to daily physical activity for a minimum of 30 minutes discussed and encouraged, as a part of hypertension management. The importance of attaining a healthy weight is also discussed.  BP/Weight 07/21/2016 03/17/2016 02/19/2016 09/26/2015 03/12/2015 05/29/2014 Q000111Q  Systolic BP A999333 123456 123456 [id number] Q000111Q [id number] A999333  Diastolic BP 80 82 92 80 80 82 90  Wt. (Lbs) 211 211 214.12 218 208.12 209 211.08  BMI 30.28 30.71 29.86 30.42 29.04 29.16 29.45       Hyperlipidemia with target LDL less than 100 Hyperlipidemia:Low fat diet discussed and encouraged.   Lipid Panel  Lab Results  Component Value Date   CHOL 161 03/14/2016   HDL 24 (L) 03/14/2016   LDLCALC 85 03/14/2016   LDLDIRECT 86 06/01/2014   TRIG 259 (H) 03/14/2016   CHOLHDL 6.7 (H) 03/14/2016   Needs to reduce fat in diet    Nicotine dependence Patient counseled for approximately 5 minutes regarding the health risks of ongoing nicotine use, specifically all types of cancer, heart disease, stroke and respiratory failure. The options available for help with cessation ,the behavioral changes to assist the process, and the option to either gradully reduce usage  Or abruptly stop.is also discussed. Pt is also encouraged to set specific goals in number of cigarettes used daily, as well as to set a quit date.     Obesity (BMI 30.0-34.9) Deteriorated. Patient re-educated about  the importance of commitment to a  minimum of 150 minutes of exercise per week.  The importance of healthy food choices with portion control discussed.  Encouraged to start a food diary, count calories and to consider  joining a support group. Sample diet sheets offered. Goals set by the patient for the next several months.   Weight /BMI 07/21/2016 03/17/2016 02/19/2016  WEIGHT 211 lb 211 lb 214 lb 1.9 oz  HEIGHT 5\' 10"  5' 9.5" 5\' 11"   BMI 30.28 kg/m2 30.71 kg/m2 29.86 kg/m2      Prediabetes Patient educated about the importance of limiting  Carbohydrate intake  , the need to commit to daily physical activity for a minimum of 30 minutes , and to commit weight loss. The fact that changes in all these areas will reduce or eliminate all together the development of diabetes is stressed.   Diabetic Labs Latest Ref Rng & Units 03/14/2016 03/31/2015 06/01/2014 01/11/2013 04/01/2011  HbA1c <5.7 % 5.7(H) 5.9(H) 6.1(H) 5.7(H) 5.9(H)  Chol 125 - 200 mg/dL 161 169 184 184 204(H)  HDL >=40 mg/dL 24(L) 24(L) 22(L) 31(L) 28(L)  Calc LDL <130 mg/dL 85 97 NOT CALC 107(H) 116(H)  Triglycerides <150 mg/dL 259(H) 239(H) 435(H) 228(H) 298(H)  Creatinine 0.60 - 1.35 mg/dL 0.95 0.91 0.95 0.81 0.94   BP/Weight 07/21/2016 03/17/2016 02/19/2016 09/26/2015 03/12/2015 05/29/2014 Q000111Q  Systolic BP A999333 123456 123456 [id number] Q000111Q [id number] A999333  Diastolic BP 80 82 92 80 80 82 90  Wt. (Lbs) 211 211 214.12 218 208.12 209 211.08  BMI 30.28 30.71 29.86 30.42 29.04 29.16 29.45   No flowsheet data found.  Updated lab needed at/ before next visit.   Metabolic syndrome X The increased risk of cardiovascular disease associated with this diagnosis, and the need to consistently work on lifestyle to change this is discussed. Following  a  heart healthy diet ,commitment to 30 minutes of exercise at least 5 days per week, as well as control of blood sugar and cholesterol , and achieving a healthy weight are all the areas to be addressed .   Back pain Improved following epiudural

## 2016-07-21 NOTE — Assessment & Plan Note (Signed)
Patient educated about the importance of limiting  Carbohydrate intake , the need to commit to daily physical activity for a minimum of 30 minutes , and to commit weight loss. The fact that changes in all these areas will reduce or eliminate all together the development of diabetes is stressed.   Diabetic Labs Latest Ref Rng & Units 03/14/2016 03/31/2015 06/01/2014 01/11/2013 04/01/2011  HbA1c <5.7 % 5.7(H) 5.9(H) 6.1(H) 5.7(H) 5.9(H)  Chol 125 - 200 mg/dL 161 169 184 184 204(H)  HDL >=40 mg/dL 24(L) 24(L) 22(L) 31(L) 28(L)  Calc LDL <130 mg/dL 85 97 NOT CALC 107(H) 116(H)  Triglycerides <150 mg/dL 259(H) 239(H) 435(H) 228(H) 298(H)  Creatinine 0.60 - 1.35 mg/dL 0.95 0.91 0.95 0.81 0.94   BP/Weight 07/21/2016 03/17/2016 02/19/2016 09/26/2015 03/12/2015 05/29/2014 Q000111Q  Systolic BP A999333 123456 123456 [id number] Q000111Q [id number] A999333  Diastolic BP 80 82 92 80 80 82 90  Wt. (Lbs) 211 211 214.12 218 208.12 209 211.08  BMI 30.28 30.71 29.86 30.42 29.04 29.16 29.45   No flowsheet data found.  Updated lab needed at/ before next visit.

## 2016-07-21 NOTE — Assessment & Plan Note (Signed)

## 2016-07-21 NOTE — Patient Instructions (Addendum)
F/u in 5 month, call if you need me before  Fasting lipid, cmp, hBA1C this week, pls resume atorvastatin ( lipitor) for cholesterol  No more smoking  It is important that you exercise regularly at least 30 minutes 5 times a week. If you develop chest pain, have severe difficulty breathing, or feel very tired, stop exercising immediately and seek medical attention    Glad that epidural has ahelped alot

## 2016-07-21 NOTE — Assessment & Plan Note (Signed)
Hyperlipidemia:Low fat diet discussed and encouraged.   Lipid Panel  Lab Results  Component Value Date   CHOL 161 03/14/2016   HDL 24 (L) 03/14/2016   LDLCALC 85 03/14/2016   LDLDIRECT 86 06/01/2014   TRIG 259 (H) 03/14/2016   CHOLHDL 6.7 (H) 03/14/2016   Needs to reduce fat in diet

## 2016-07-21 NOTE — Assessment & Plan Note (Signed)
Deteriorated. Patient re-educated about  the importance of commitment to a  minimum of 150 minutes of exercise per week.  The importance of healthy food choices with portion control discussed. Encouraged to start a food diary, count calories and to consider  joining a support group. Sample diet sheets offered. Goals set by the patient for the next several months.   Weight /BMI 07/21/2016 03/17/2016 02/19/2016  WEIGHT 211 lb 211 lb 214 lb 1.9 oz  HEIGHT 5\' 10"  5' 9.5" 5\' 11"   BMI 30.28 kg/m2 30.71 kg/m2 29.86 kg/m2

## 2016-07-21 NOTE — Assessment & Plan Note (Signed)
Improved following epiudural

## 2016-07-21 NOTE — Assessment & Plan Note (Signed)
The increased risk of cardiovascular disease associated with this diagnosis, and the need to consistently work on lifestyle to change this is discussed. Following  a  heart healthy diet ,commitment to 30 minutes of exercise at least 5 days per week, as well as control of blood sugar and cholesterol , and achieving a healthy weight are all the areas to be addressed .  

## 2016-07-24 ENCOUNTER — Other Ambulatory Visit: Payer: Self-pay | Admitting: Family Medicine

## 2016-08-04 ENCOUNTER — Other Ambulatory Visit: Payer: Self-pay

## 2016-08-04 MED ORDER — AMLODIPINE BESYLATE 5 MG PO TABS: 5.0000 mg | ORAL_TABLET | Freq: Every day | ORAL | 1 refills | Status: DC

## 2016-08-04 MED ORDER — ATORVASTATIN CALCIUM 10 MG PO TABS: 10.0000 mg | ORAL_TABLET | Freq: Every day | ORAL | 1 refills | Status: DC

## 2016-08-21 ENCOUNTER — Other Ambulatory Visit: Payer: Self-pay | Admitting: Family Medicine

## 2016-12-18 ENCOUNTER — Ambulatory Visit: Payer: BLUE CROSS/BLUE SHIELD | Admitting: Family Medicine

## 2016-12-24 ENCOUNTER — Encounter: Payer: BLUE CROSS/BLUE SHIELD | Admitting: Family Medicine

## 2017-01-13 ENCOUNTER — Ambulatory Visit: Payer: BLUE CROSS/BLUE SHIELD | Admitting: Family Medicine

## 2017-01-22 ENCOUNTER — Ambulatory Visit (INDEPENDENT_AMBULATORY_CARE_PROVIDER_SITE_OTHER): Payer: BLUE CROSS/BLUE SHIELD | Admitting: Family Medicine

## 2017-01-22 ENCOUNTER — Encounter: Payer: Self-pay | Admitting: Family Medicine

## 2017-01-22 VITALS — BP 138/84 | HR 78 | Resp 16 | Ht 70.0 in | Wt 218.0 lb

## 2017-01-22 DIAGNOSIS — E669 Obesity, unspecified: Secondary | ICD-10-CM

## 2017-01-22 DIAGNOSIS — F17218 Nicotine dependence, cigarettes, with other nicotine-induced disorders: Secondary | ICD-10-CM

## 2017-01-22 DIAGNOSIS — R7303 Prediabetes: Secondary | ICD-10-CM | POA: Diagnosis not present

## 2017-01-22 DIAGNOSIS — I10 Essential (primary) hypertension: Secondary | ICD-10-CM | POA: Diagnosis not present

## 2017-01-22 DIAGNOSIS — E785 Hyperlipidemia, unspecified: Secondary | ICD-10-CM | POA: Diagnosis not present

## 2017-01-22 DIAGNOSIS — E8881 Metabolic syndrome: Secondary | ICD-10-CM

## 2017-01-22 MED ORDER — NICOTINE 7 MG/24HR TD PT24: 7.0000 mg | MEDICATED_PATCH | Freq: Every day | TRANSDERMAL | 1 refills | Status: DC

## 2017-01-22 NOTE — Patient Instructions (Addendum)
Physical exam in 4 months, call if you need me before  Good blood pressure  Fasting lipid cmp and EGFr and HBA1C this week   8 pound weight loss goal  It is important that you exercise regularly at least 30 minutes 7 times a week. If you develop chest pain, have severe difficulty breathing, or feel very tired, stop exercising immediately and seek medical attention   Stop smoke daytomorrow, use patch instead for 3 to 6 weeks, no cigarettes with patch SMOKING increases risk of all cancers, heart disease and stroke  Thank you  for choosing [facility]. We consider it a privelige to serve you.  Delivering excellent health care in a caring and  compassionate way is our goal.  Partnering with you,  so that together we can achieve this goal is our strategy.  ]

## 2017-01-24 ENCOUNTER — Encounter: Payer: Self-pay | Admitting: Family Medicine

## 2017-01-24 MED ORDER — AMLODIPINE BESYLATE 5 MG PO TABS
5.0000 mg | ORAL_TABLET | Freq: Every day | ORAL | 1 refills | Status: DC
Start: 2017-01-24 — End: 2017-02-18

## 2017-01-24 NOTE — Assessment & Plan Note (Signed)
Hyperlipidemia:Low fat diet discussed and encouraged.   Lipid Panel  Lab Results  Component Value Date   CHOL 161 03/14/2016   HDL 24 (L) 03/14/2016   LDLCALC 85 03/14/2016   LDLDIRECT 86 06/01/2014   TRIG 259 (H) 03/14/2016   CHOLHDL 6.7 (H) 03/14/2016   Updated lab needed t.

## 2017-01-24 NOTE — Assessment & Plan Note (Signed)
Patient educated about the importance of limiting  Carbohydrate intake , the need to commit to daily physical activity for a minimum of 30 minutes , and to commit weight loss. The fact that changes in all these areas will reduce or eliminate all together the development of diabetes is stressed.  Updated lab needed at/ before next visit.  Diabetic Labs Latest Ref Rng & Units 03/14/2016 03/31/2015 06/01/2014 01/11/2013 04/01/2011  HbA1c <5.7 % 5.7(H) 5.9(H) 6.1(H) 5.7(H) 5.9(H)  Chol 125 - 200 mg/dL 161 169 184 184 204(H)  HDL >=40 mg/dL 24(L) 24(L) 22(L) 31(L) 28(L)  Calc LDL <130 mg/dL 85 97 NOT CALC 107(H) 116(H)  Triglycerides <150 mg/dL 259(H) 239(H) 435(H) 228(H) 298(H)  Creatinine 0.60 - 1.35 mg/dL 0.95 0.91 0.95 0.81 0.94   BP/Weight 01/22/2017 07/21/2016 03/17/2016 02/19/2016 09/26/2015 03/12/2015 02/40/9735  Systolic BP 329 924 268 341 962 229 798  Diastolic BP 84 80 82 92 80 80 82  Wt. (Lbs) 218 211 211 214.12 218 208.12 209  BMI 31.28 30.28 30.71 29.86 30.42 29.04 29.16   No flowsheet data found.

## 2017-01-24 NOTE — Assessment & Plan Note (Signed)
Controlled, no change in medication DASH diet and commitment to daily physical activity for a minimum of 30 minutes discussed and encouraged, as a part of hypertension management. The importance of attaining a healthy weight is also discussed.  BP/Weight 01/22/2017 07/21/2016 03/17/2016 02/19/2016 09/26/2015 03/12/2015 70/62/3762  Systolic BP 831 517 616 [phone] 948  Diastolic BP 84 80 82 92 80 80 82  Wt. (Lbs) 218 211 211 214.12 218 208.12 209  BMI 31.28 30.28 30.71 29.86 30.42 29.04 29.16

## 2017-01-24 NOTE — Assessment & Plan Note (Signed)
Deteriorated. Patient re-educated about  the importance of commitment to a  minimum of 150 minutes of exercise per week.  The importance of healthy food choices with portion control discussed. Encouraged to start a food diary, count calories and to consider  joining a support group. Sample diet sheets offered. Goals set by the patient for the next several months.   Weight /BMI 01/22/2017 07/21/2016 03/17/2016  WEIGHT 218 lb 211 lb 211 lb  HEIGHT 5\' 10"  5\' 10"  5' 9.5"  BMI 31.28 kg/m2 30.28 kg/m2 30.71 kg/m2

## 2017-01-24 NOTE — Progress Notes (Signed)
Evan Black     MRN: [id number]      DOB: 1971/04/29   HPI Mr. Fredin is here for follow up and re-evaluation of chronic medical conditions, medication management and review of any available recent lab and radiology data.  Preventive health is updated, specifically  Cancer screening and Immunization.   Questions or concerns regarding consultations or procedures which the PT has had in the interim are  addressed. The PT denies any adverse reactions to current medications since the last visit.  There are no new concerns.  There are no specific complaints   ROS Denies recent fever or chills. Denies sinus pressure, nasal congestion, ear pain or sore throat. Denies chest congestion, productive cough or wheezing. Denies chest pains, palpitations and leg swelling Denies abdominal pain, nausea, vomiting,diarrhea or constipation.   Denies dysuria, frequency, hesitancy or incontinence. Denies joint pain, swelling and limitation in mobility. Denies headaches, seizures, numbness, or tingling. Denies depression, anxiety or insomnia. Denies skin break down or rash.   PE  BP 138/84   Pulse 78   Resp 16   Ht 5\' 10"  (1.778 m)   Wt 218 lb (98.9 kg)   SpO2 93%   BMI 31.28 kg/m   Patient alert and oriented and in no cardiopulmonary distress.  HEENT: No facial asymmetry, EOMI,   oropharynx pink and moist.  Neck supple no JVD, no mass.  Chest: Clear to auscultation bilaterally.  CVS: S1, S2 no murmurs, no S3.Regular rate.  ABD: Soft non tender.   Ext: No edema  MS: Adequate ROM spine, shoulders, hips and knees.  Skin: Intact, no ulcerations or rash noted.  Psych: Good eye contact, normal affect. Memory intact not anxious or depressed appearing.  CNS: CN 2-12 intact, power,  normal throughout.no focal deficits noted.   Assessment & Plan  Essential hypertension Controlled, no change in medication DASH diet and commitment to daily physical activity for a minimum of 30 minutes  discussed and encouraged, as a part of hypertension management. The importance of attaining a healthy weight is also discussed.  BP/Weight 01/22/2017 07/21/2016 03/17/2016 02/19/2016 09/26/2015 03/12/2015 49/70/2637  Systolic BP 858 850 277 412 878 676 720  Diastolic BP 84 80 82 92 80 80 82  Wt. (Lbs) 218 211 211 214.12 218 208.12 209  BMI 31.28 30.28 30.71 29.86 30.42 29.04 29.16       Nicotine dependence Patient is asked and  confirms current  Nicotine use.  Five to seven minutes of time is spent in counseling the patient of the need to quit smoking  Advice to quit is delivered clearly specifically in reducing the risk of developing heart disease, having a stroke, or of developing all types of cancer, especially lung and oral cancer. Improvement in breathing and exercise tolerance and quality of life is also discussed, as is the economic benefit.  Assessment of willingness to quit or to make an attempt to quit is made and documented  Assistance in quit attempt is made with several and varied options presented, based on patient's desire and need. These include  literature, local classes available, 1800 QUIT NOW number, OTC and prescription medication.  The GOAL to be NICOTINE FREE is re emphasized.  The patient has set a personal goal of either reduction or discontinuation and follow up is arranged between 6 an 16 weeks.    Obesity (BMI 30.0-34.9) Deteriorated. Patient re-educated about  the importance of commitment to a  minimum of 150 minutes of exercise per week.  The  importance of healthy food choices with portion control discussed. Encouraged to start a food diary, count calories and to consider  joining a support group. Sample diet sheets offered. Goals set by the patient for the next several months.   Weight /BMI 01/22/2017 07/21/2016 03/17/2016  WEIGHT 218 lb 211 lb 211 lb  HEIGHT 5\' 10"  5\' 10"  5' 9.5"  BMI 31.28 kg/m2 30.28 kg/m2 30.71 kg/m2      Prediabetes Patient  educated about the importance of limiting  Carbohydrate intake , the need to commit to daily physical activity for a minimum of 30 minutes , and to commit weight loss. The fact that changes in all these areas will reduce or eliminate all together the development of diabetes is stressed.  Updated lab needed at/ before next visit.  Diabetic Labs Latest Ref Rng & Units 03/14/2016 03/31/2015 06/01/2014 01/11/2013 04/01/2011  HbA1c <5.7 % 5.7(H) 5.9(H) 6.1(H) 5.7(H) 5.9(H)  Chol 125 - 200 mg/dL 161 169 184 184 204(H)  HDL >=40 mg/dL 24(L) 24(L) 22(L) 31(L) 28(L)  Calc LDL <130 mg/dL 85 97 NOT CALC 107(H) 116(H)  Triglycerides <150 mg/dL 259(H) 239(H) 435(H) 228(H) 298(H)  Creatinine 0.60 - 1.35 mg/dL 0.95 0.91 0.95 0.81 0.94   BP/Weight 01/22/2017 07/21/2016 03/17/2016 02/19/2016 09/26/2015 03/12/2015 95/12/2255  Systolic BP 505 183 358 251 898 421 031  Diastolic BP 84 80 82 92 80 80 82  Wt. (Lbs) 218 211 211 214.12 218 208.12 209  BMI 31.28 30.28 30.71 29.86 30.42 29.04 29.16   No flowsheet data found.    Hyperlipidemia with target LDL less than 100 Hyperlipidemia:Low fat diet discussed and encouraged.   Lipid Panel  Lab Results  Component Value Date   CHOL 161 03/14/2016   HDL 24 (L) 03/14/2016   LDLCALC 85 03/14/2016   LDLDIRECT 86 06/01/2014   TRIG 259 (H) 03/14/2016   CHOLHDL 6.7 (H) 03/14/2016   Updated lab needed t.     Metabolic syndrome X The increased risk of cardiovascular disease associated with this diagnosis, and the need to consistently work on lifestyle to change this is discussed. Following  a  heart healthy diet ,commitment to 30 minutes of exercise at least 5 days per week, as well as control of blood sugar and cholesterol , and achieving a healthy weight are all the areas to be addressed .

## 2017-01-24 NOTE — Assessment & Plan Note (Signed)

## 2017-01-24 NOTE — Assessment & Plan Note (Signed)
The increased risk of cardiovascular disease associated with this diagnosis, and the need to consistently work on lifestyle to change this is discussed. Following  a  heart healthy diet ,commitment to 30 minutes of exercise at least 5 days per week, as well as control of blood sugar and cholesterol , and achieving a healthy weight are all the areas to be addressed .  

## 2017-02-18 ENCOUNTER — Other Ambulatory Visit: Payer: Self-pay | Admitting: Family Medicine

## 2017-02-18 NOTE — Telephone Encounter (Signed)
pls send meds  as requested

## 2017-02-18 NOTE — Telephone Encounter (Signed)
Patients spouse called in requesting a 90 day supply of her medications be sent to [facility]:  Atorbastatin, methocarbamol, flexeril, blood pressure medication, and tramadol

## 2017-02-22 ENCOUNTER — Other Ambulatory Visit: Payer: Self-pay | Admitting: Family Medicine

## 2017-02-22 MED ORDER — ATORVASTATIN CALCIUM 10 MG PO TABS: 10.0000 mg | ORAL_TABLET | Freq: Every day | ORAL | 1 refills | Status: DC

## 2017-02-22 MED ORDER — METHOCARBAMOL 500 MG PO TABS: 500.0000 mg | ORAL_TABLET | Freq: Three times a day (TID) | ORAL | 0 refills | Status: DC | PRN

## 2017-02-22 MED ORDER — CYCLOBENZAPRINE HCL 10 MG PO TABS: ORAL_TABLET | ORAL | 0 refills | Status: DC

## 2017-02-22 MED ORDER — TRAMADOL HCL 50 MG PO TABS: 50.0000 mg | ORAL_TABLET | Freq: Three times a day (TID) | ORAL | 0 refills | Status: DC | PRN

## 2017-02-22 MED ORDER — AMLODIPINE BESYLATE 5 MG PO TABS: 5.0000 mg | ORAL_TABLET | Freq: Every day | ORAL | 1 refills | Status: DC

## 2017-02-22 NOTE — Telephone Encounter (Signed)
Tramadol is not refilled

## 2017-02-22 NOTE — Telephone Encounter (Signed)
atorvastatin amlodipine and flexeril are sent to [facility]. Methocarbamol is not refilled

## 2017-02-23 ENCOUNTER — Other Ambulatory Visit: Payer: Self-pay | Admitting: Family Medicine

## 2017-02-25 ENCOUNTER — Telehealth: Payer: Self-pay | Admitting: Family Medicine

## 2017-02-25 NOTE — Telephone Encounter (Signed)
Patient's wife calling re the FMLA for dropped off 02-17-17.  She is asking if this has been completed.  She would like faxed and he will pick up original from [place]. Wife states that the forms have completed by Dr. Moshe Cipro in the past. This is to cover him for when he is unable to work during flare ups with his lower back.  Employer requires this every 6 months.  She states this must be completed and faxed back no later than tomorrow (02/26/17).    Wife's cb  [phone]

## 2017-02-25 NOTE — Telephone Encounter (Signed)
I called and spoke to his wife, after I reviewed his record, I have not filled out fMLA forms for his back problem in the past. She stated she probably "got it wrong' and that ortho may have been filling out the form for him She stated that he is nothaving recurren tflares needing to be out of work. She agreed to calling ortho office to follow up with them

## 2017-03-09 ENCOUNTER — Encounter: Payer: Self-pay | Admitting: Family Medicine

## 2017-03-09 LAB — COMPLETE METABOLIC PANEL WITH GFR
AG RATIO: 1.8 (calc) (ref 1.0–2.5)
ALKALINE PHOSPHATASE (APISO): 51 U/L (ref 40–115)
ALT: 30 U/L (ref 9–46)
AST: 24 U/L (ref 10–40)
Albumin: 4.1 g/dL (ref 3.6–5.1)
BILIRUBIN TOTAL: 0.5 mg/dL (ref 0.2–1.2)
BUN: 11 mg/dL (ref 7–25)
CO2: 30 mmol/L (ref 20–32)
Calcium: 9 mg/dL (ref 8.6–10.3)
Chloride: 105 mmol/L (ref 98–110)
Creat: 0.9 mg/dL (ref 0.60–1.35)
GFR, EST NON AFRICAN AMERICAN: 101 mL/min/{1.73_m2} (ref >=60)
GFR, Est African American: 117 mL/min/{1.73_m2} (ref >=60)
GLOBULIN: 2.3 g/dL (ref 1.9–3.7)
Glucose, Bld: 95 mg/dL (ref 65–99)
POTASSIUM: 4 mmol/L (ref 3.5–5.3)
SODIUM: 139 mmol/L (ref 135–146)
Total Protein: 6.4 g/dL (ref 6.1–8.1)

## 2017-03-09 LAB — LIPID PANEL
CHOL/HDL RATIO: 4.3 (calc) (ref <5.0)
CHOLESTEROL: 113 mg/dL (ref <200)
HDL: 26 mg/dL — AB (ref >40)
LDL CHOLESTEROL (CALC): 64 mg/dL
Non-HDL Cholesterol (Calc): 87 mg/dL (calc) (ref <130)
TRIGLYCERIDES: 150 mg/dL — AB (ref <150)

## 2017-03-09 LAB — HEMOGLOBIN A1C
EAG (MMOL/L): 6.3 (calc)
HEMOGLOBIN A1C: 5.6 %{Hb} (ref <5.7)
MEAN PLASMA GLUCOSE: 114 (calc)

## 2017-04-13 ENCOUNTER — Telehealth: Payer: Self-pay | Admitting: *Deleted

## 2017-04-13 ENCOUNTER — Ambulatory Visit: Payer: BLUE CROSS/BLUE SHIELD | Admitting: Family Medicine

## 2017-04-13 MED ORDER — OSELTAMIVIR PHOSPHATE 75 MG PO CAPS: 75.0000 mg | ORAL_CAPSULE | Freq: Two times a day (BID) | ORAL | 0 refills | Status: DC

## 2017-04-13 NOTE — Telephone Encounter (Signed)
Delle Reining called for patient stating the patient may have the flu or something, patient is sick and did not work today. Trilby Leaver is requesting for patient to be seen today. Can patient be seen by Dr Meda Coffee? Dr Meda Coffee has a 3:00 new patient opening. [phone].

## 2017-04-13 NOTE — Telephone Encounter (Signed)
Patient is coming in today at 2;40 to see Dr Meda Coffee. Trilby Leaver is aware. Trilby Leaver is bringing patient to appt

## 2017-04-13 NOTE — Telephone Encounter (Signed)
Will call and triage. Called pt no answer. Mother in law will try to contact and have him call back

## 2017-04-13 NOTE — Telephone Encounter (Signed)
Triaged and tamiflu sent in per protocol. (see below)  How long do you want to take out of work. Will notify patient

## 2017-04-13 NOTE — Telephone Encounter (Signed)
Acute onset of fever Yes.   Headache Yes.   Body ache Yes.   Fatigue Yes.   Non productive cough Yes.   Sore throat Yes.   Nasal discharge Yes.    Onset between 1-4 days of possible exposure Yes.    Recommendation:  Fluid, rest, Tylenol for control of fever  Expect 5 days before feeling better and not so contagious and generally better in 7-10 days.  Standard treatment Tama flu 75 mg 1 tablet twice daily times 5 days  Please call office if symptoms do not improve or worsen in 5 days after beginning treatment.      

## 2017-04-13 NOTE — Telephone Encounter (Signed)
Return to work 04/16/2018 needs to be afebrile for 24 hrs documented, needs to check his temp every day call . For appt to be seen by  Wednesday if he still has a fever or if he worsens as in shortness of breath, chills , rigors  etc

## 2017-04-13 NOTE — Telephone Encounter (Signed)
pls send to Dr Meda Coffee

## 2017-04-13 NOTE — Addendum Note (Signed)
Addended by: Eual Fines on: 04/13/2017 01:25 PM   Modules accepted: Orders

## 2017-04-14 NOTE — Telephone Encounter (Signed)
Work note done and patient aware to call for appt on Wednesday if still running temp

## 2017-04-16 ENCOUNTER — Other Ambulatory Visit: Payer: Self-pay

## 2017-04-16 ENCOUNTER — Ambulatory Visit: Payer: BLUE CROSS/BLUE SHIELD | Admitting: Family Medicine

## 2017-04-16 ENCOUNTER — Encounter: Payer: Self-pay | Admitting: Family Medicine

## 2017-04-16 VITALS — BP 148/88 | HR 84 | Temp 98.6°F | Resp 16 | Wt 218.2 lb

## 2017-04-16 DIAGNOSIS — I10 Essential (primary) hypertension: Secondary | ICD-10-CM | POA: Diagnosis not present

## 2017-04-16 DIAGNOSIS — J101 Influenza due to other identified influenza virus with other respiratory manifestations: Secondary | ICD-10-CM | POA: Diagnosis not present

## 2017-04-16 DIAGNOSIS — Z720 Tobacco use: Secondary | ICD-10-CM

## 2017-04-16 NOTE — Patient Instructions (Signed)
Advil 200mg  tablets, 3-4 pills every 8 hours

## 2017-04-16 NOTE — Progress Notes (Signed)
Patient ID: Evan Black, male    DOB: 04-16-1971, 46 y.o.   MRN: [id number]  Chief Complaint  Patient presents with  . Cough  . Generalized Body Aches    Allergies Patient has no known allergies.  Subjective:   Evan Black is a 46 y.o. male who presents to [facility] today.  HPI Evan Black comes in today because he is not feeling better.  He reports that he is not feeling any worse but he felt like he should be feeling better by now.  He he reports that he started feeling bad about a week ago.  He reports that four days after starting with his  symptoms, he called our office and was given a prescription for Tamiflu.  He started the medication right after it was called in.  He reports that since that time he is continued to have a cough productive of white to yellow sputum.  He reports that his body still hurts.  He reports his headache is better but overall he still feels bad.  He reports he has not been smoking since this occurred.  He is eating and drinking normal.  Urinating normal.  He denies any shortness of breath.  He denies any chest tightness or pain with breathing.  He reports he is still having some chills and when he sleeps he sweats at times.  He has been using Excedrin and Advil over-the-counter.  He did not get a flu shot.  He reports he does not need a note for work but came in because he was not feeling better and his work does not want him to come back yet.  Reports that he has been coughing now and feeling bad for about 6 days total.    Cough  This is a new problem. The current episode started in the past 7 days. The problem has been unchanged. The problem occurs hourly. Cough characteristics: Productive of white to yellow sputum. Associated symptoms include chills, a fever, myalgias, nasal congestion, rhinorrhea and sweats. Pertinent negatives include no chest pain, ear congestion, ear pain, headaches, heartburn, hemoptysis, postnasal drip, rash, sore  throat, shortness of breath or wheezing. He has tried rest for the symptoms. The treatment provided mild relief. There is no history of asthma, bronchitis, COPD, environmental allergies or pneumonia.    Past Medical History:  Diagnosis Date  . Hypertension     Past Surgical History:  Procedure Laterality Date  . BACK SURGERY      No family history on file.   Social History   Socioeconomic History  . Marital status: Married    Spouse name: None  . Number of children: None  . Years of education: None  . Highest education level: None  Social Needs  . Financial resource strain: None  . Food insecurity - worry: None  . Food insecurity - inability: None  . Transportation needs - medical: None  . Transportation needs - non-medical: None  Occupational History  . None  Tobacco Use  . Smoking status: Light Tobacco Smoker    Packs/day: 0.10    Types: Cigarettes  . Smokeless tobacco: Former Systems developer    Quit date: 12/24/2012  Substance and Sexual Activity  . Alcohol use: Yes    Alcohol/week: 10.8 oz    Types: 18 Cans of beer per week  . Drug use: No  . Sexual activity: Yes  Other Topics Concern  . None  Social History Narrative  . None    Review  of Systems  Constitutional: Positive for chills and fever.  HENT: Positive for rhinorrhea. Negative for ear pain, postnasal drip and sore throat.   Respiratory: Positive for cough. Negative for hemoptysis, shortness of breath and wheezing.   Cardiovascular: Negative for chest pain.  Gastrointestinal: Negative for heartburn.  Musculoskeletal: Positive for myalgias.  Skin: Negative for rash.  Allergic/Immunologic: Negative for environmental allergies.  Neurological: Negative for headaches.     Objective:   BP (!) 148/88 (BP Location: Left Arm, Patient Position: Sitting, Cuff Size: Normal)   Pulse 84   Temp 98.6 F (37 C) (Other (Comment))   Resp 16   Wt 218 lb 4 oz (99 kg)   SpO2 97%   BMI 31.32 kg/m   Physical Exam    Constitutional: He is oriented to person, place, and time. He appears well-developed and well-nourished.  HENT:  Head: Normocephalic and atraumatic.  Right Ear: External ear normal.  Left Ear: External ear normal.  Nose: Nose normal.  Mouth/Throat: Oropharynx is clear and moist. No oropharyngeal exudate.  Turbinate hyperemia bilaterally.  Clear rhinorrhea present.  Oral cavity clear without lesions.  No oral cavity edema.  Tympanic membranes clear bilaterally.  No exudate on pharynx.  Moist mucous membranes.  Eyes: EOM are normal. Pupils are equal, round, and reactive to light. Right eye exhibits no discharge. Left eye exhibits no discharge.  Neck: Normal range of motion. Neck supple. No JVD present. No tracheal deviation present. No thyromegaly present.  Cardiovascular: Normal rate, regular rhythm, normal heart sounds and intact distal pulses.  Pulses:      Dorsalis pedis pulses are 2+ on the right side, and 2+ on the left side.  Pulmonary/Chest: Effort normal and breath sounds normal. No accessory muscle usage or stridor. No tachypnea. No respiratory distress. He has no wheezes.  Abdominal: Soft. Bowel sounds are normal.  Musculoskeletal: He exhibits no edema.  Lymphadenopathy:    He has no cervical adenopathy.  Neurological: He is alert and oriented to person, place, and time. No cranial nerve deficit.  Skin: Skin is warm, dry and intact. No rash noted. No erythema.  Psychiatric: He has a normal mood and affect. His behavior is normal. Thought content normal.  Vitals reviewed.    Assessment and Plan  1. Influenza due to seasonal influenza virus Counseled patient today that this is actually the normal course of the flu.  Discussed Tamiflu use with him today and I recommended that he continue his course of medication.  Suspect that the Tamiflu was started to late in his course to give him much therapeutic benefit, however it could help decrease his symptoms/complications.  Patient was  told to continue to increase his fluids and to rest.  He reports his symptoms of cough are not bothersome to him but he was told that he could use an over-the-counter cough medicine.  He was told to stop the use of Excedrin and just to use either Tylenol or ibuprofen over-the-counter.  He was given information and handouts regarding the normal flu course.  He was told to call at any point or return to clinic if he developed any breathing difficulties, shortness of breath, chest pain, inability to tolerate liquids, or other worrisome symptoms.  He was given an updated note for work and explained that he should not return to he has been afebrile off of antipyretics for 24 hours.  2. Essential hypertension Patient was told to follow-up for blood pressure check within 1 month.  3. Tobacco abuse He  was counseled that he should continue to abstain from tobacco use.  We discussed that he should not restart smoking upon resolution of his flu virus. Office visit was greater than 25 minutes.  Greater than 50% of office visit spent counseling, reassuring, and coordinating care. He was told to call with any questions or concerns.  All of his questions were answered today. Return in about 4 weeks (around 05/14/2017), or if symptoms worsen or fail to improve and BP in one month with PCP. Caren Macadam, MD 04/16/2017

## 2017-05-25 ENCOUNTER — Ambulatory Visit (INDEPENDENT_AMBULATORY_CARE_PROVIDER_SITE_OTHER): Payer: BLUE CROSS/BLUE SHIELD | Admitting: Family Medicine

## 2017-05-25 ENCOUNTER — Encounter: Payer: Self-pay | Admitting: Family Medicine

## 2017-05-25 VITALS — BP 130/90 | HR 82 | Resp 16 | Ht 70.0 in | Wt 221.0 lb

## 2017-05-25 DIAGNOSIS — Z0000 Encounter for general adult medical examination without abnormal findings: Secondary | ICD-10-CM | POA: Diagnosis not present

## 2017-05-25 DIAGNOSIS — Z1211 Encounter for screening for malignant neoplasm of colon: Secondary | ICD-10-CM | POA: Diagnosis not present

## 2017-05-25 DIAGNOSIS — I10 Essential (primary) hypertension: Secondary | ICD-10-CM

## 2017-05-25 DIAGNOSIS — Z23 Encounter for immunization: Secondary | ICD-10-CM | POA: Diagnosis not present

## 2017-05-25 DIAGNOSIS — F17218 Nicotine dependence, cigarettes, with other nicotine-induced disorders: Secondary | ICD-10-CM | POA: Diagnosis not present

## 2017-05-25 LAB — POC HEMOCCULT BLD/STL (OFFICE/1-CARD/DIAGNOSTIC): Fecal Occult Blood, POC: NEGATIVE

## 2017-05-25 NOTE — Patient Instructions (Signed)
F/u in 4.5 months, call if you need me sooner  Blood pressure and weight are increased, work on regular exercise and change in diet to improve both please  Weight loss goal of 8 to 10 pounds  Stop ginger Ale and cut out the cakes and pies and ice cream and sweets please!  Flu vaccine today  'Fasting lipid, cmp and eGFr, hBA1C , PSA 1 week before next visit  Thank you  for choosing [facility]. We consider it a privelige to serve you.  Delivering excellent health care in a caring and  compassionate way is our goal.  Partnering with you,  so that together we can achieve this goal is our strategy.

## 2017-05-25 NOTE — Progress Notes (Signed)
   Evan Black     MRN: [id number]      DOB: 1971/06/03   HPI: Patient is in for annual physical exam. No other health concerns are expressed or addressed at the visit. Recent labs, if available are reviewed. Immunization is reviewed , and  updated if needed.    PE; BP 130/90   Pulse 82   Resp 16   Ht 5\' 10"  (1.778 m)   Wt 221 lb (100.2 kg)   SpO2 94%   BMI 31.71 kg/m   Pleasant male, alert and oriented x 3, in no cardio-pulmonary distress. Afebrile. HEENT No facial trauma or asymetry. Sinuses non tender. EOMI External ears normal, tympanic membranes clear. Oropharynx moist, no exudate. Neck: supple, no adenopathy,JVD or thyromegaly.No bruits.  Chest: Clear to ascultation bilaterally.No crackles or wheezes. Non tender to palpation  Breast: No asymetry,no masses. No nipple discharge or inversion. No axillary or supraclavicular adenopathy  Cardiovascular system; Heart sounds normal,  S1 and  S2 ,no S3.  No murmur, or thrill. Apical beat not displaced Peripheral pulses normal.  Abdomen: Soft, non tender, no organomegaly or masses. No bruits. Bowel sounds normal. No guarding, tenderness or rebound.  Rectal:  Normal sphincter tone. No hemorrhoids or  masses. guaiac negative stool. Prostate smooth and firm    Musculoskeletal exam: Full ROM of spine, hips , shoulders and knees. No deformity ,swelling or crepitus noted. No muscle wasting or atrophy.   Neurologic: Cranial nerves 2 to 12 intact. Power, tone ,sensation and reflexes normal throughout. No disturbance in gait. No tremor.  Skin: Intact, no ulceration, erythema , scaling or rash noted. Pigmentation normal throughout  Psych; Normal mood and affect. Judgement and concentration normal   Assessment & Plan:  Annual physical exam Annual exam as documented. Counseling done  re healthy lifestyle involving commitment to 150 minutes exercise per week, heart healthy diet, and attaining healthy  weight.The importance of adequate sleep also discussed. Regular seat belt use and home safety, is also discussed. Changes in health habits are decided on by the patient with goals and time frames  set for achieving them. Immunization and cancer screening needs are specifically addressed at this visit.   Essential hypertension Not at goal, however pt has gained weight and is not commited to regular exercise, no med change DASH diet and commitment to daily physical activity for a minimum of 30 minutes discussed and encouraged, as a part of hypertension management. The importance of attaining a healthy weight is also discussed.  BP/Weight 05/25/2017 04/16/2017 01/22/2017 07/21/2016 03/17/2016 02/19/2016 1/93/7902  Systolic BP 409 735 329 924 268 341 962  Diastolic BP 90 88 84 80 82 92 80  Wt. (Lbs) 221 218.25 218 211 211 214.12 218  BMI 31.71 31.32 31.28 30.28 30.71 29.86 30.42       Need for immunization against influenza After obtaining informed consent, the vaccine is  administered by LPN.   Nicotine dependence Reports smoking 3 cigarettes this past weekend after quitting for a long period, commits to remaining quit

## 2017-05-25 NOTE — Assessment & Plan Note (Signed)

## 2017-05-29 ENCOUNTER — Encounter: Payer: Self-pay | Admitting: Family Medicine

## 2017-05-29 DIAGNOSIS — Z23 Encounter for immunization: Secondary | ICD-10-CM | POA: Insufficient documentation

## 2017-05-29 NOTE — Assessment & Plan Note (Signed)
Not at goal, however pt has gained weight and is not commited to regular exercise, no med change DASH diet and commitment to daily physical activity for a minimum of 30 minutes discussed and encouraged, as a part of hypertension management. The importance of attaining a healthy weight is also discussed.  BP/Weight 05/25/2017 04/16/2017 01/22/2017 07/21/2016 03/17/2016 02/19/2016 2/44/9753  Systolic BP 005 110 211 173 567 014 103  Diastolic BP 90 88 84 80 82 92 80  Wt. (Lbs) 221 218.25 218 211 211 214.12 218  BMI 31.71 31.32 31.28 30.28 30.71 29.86 30.42

## 2017-05-29 NOTE — Assessment & Plan Note (Signed)
Reports smoking 3 cigarettes this past weekend after quitting for a long period, commits to remaining quit

## 2017-05-29 NOTE — Assessment & Plan Note (Signed)
After obtaining informed consent, the vaccine is  administered by LPN.  

## 2017-07-13 ENCOUNTER — Other Ambulatory Visit: Payer: Self-pay | Admitting: Family Medicine

## 2017-08-31 ENCOUNTER — Ambulatory Visit (INDEPENDENT_AMBULATORY_CARE_PROVIDER_SITE_OTHER): Payer: BLUE CROSS/BLUE SHIELD | Admitting: Family Medicine

## 2017-08-31 ENCOUNTER — Encounter: Payer: Self-pay | Admitting: Family Medicine

## 2017-08-31 VITALS — BP 140/90 | HR 76 | Resp 16 | Ht 70.0 in | Wt 223.0 lb

## 2017-08-31 DIAGNOSIS — E669 Obesity, unspecified: Secondary | ICD-10-CM

## 2017-08-31 DIAGNOSIS — I10 Essential (primary) hypertension: Secondary | ICD-10-CM

## 2017-08-31 DIAGNOSIS — E785 Hyperlipidemia, unspecified: Secondary | ICD-10-CM | POA: Diagnosis not present

## 2017-08-31 DIAGNOSIS — M5440 Lumbago with sciatica, unspecified side: Secondary | ICD-10-CM | POA: Diagnosis not present

## 2017-08-31 DIAGNOSIS — F17218 Nicotine dependence, cigarettes, with other nicotine-induced disorders: Secondary | ICD-10-CM

## 2017-08-31 MED ORDER — SPIRONOLACTONE 25 MG PO TABS: 25.0000 mg | ORAL_TABLET | Freq: Every day | ORAL | 5 refills | Status: DC

## 2017-08-31 MED ORDER — FLUTICASONE PROPIONATE 50 MCG/ACT NA SUSP: 1.0000 | Freq: Every day | NASAL | 5 refills | Status: DC

## 2017-08-31 NOTE — Patient Instructions (Signed)
Nurse BP check in early June  New for blood pressure is spironolactone one daily, continue amlodipine as before  Pls STOP cigarettes entirely by September 07, 2017   Reduce beer, A LOT of calories   F/U in 6 months, with weight loss goal of 12 pounds  Thank you  for choosing [facility]. We consider it a privelige to serve you.  Delivering excellent health care in a caring and  compassionate way is our goal.  Partnering with you,  so that together we can achieve this goal is our strategy.       Marland Kitchen

## 2017-09-06 NOTE — Assessment & Plan Note (Addendum)
No current flare, controlled with flexeril, as needed, which is very infrequently

## 2017-09-06 NOTE — Assessment & Plan Note (Addendum)
Increased and uncontrolled at visit Spironolactone added, nurse BP check  In 3 weeks DASH diet and commitment to daily physical activity for a minimum of 30 minutes discussed and encouraged, as a part of hypertension management. The importance of attaining a healthy weight is also discussed.  BP/Weight 08/31/2017 05/25/2017 04/16/2017 01/22/2017 07/21/2016 03/17/2016 7/91/5056  Systolic BP 979 480 165 537 482 707 867  Diastolic BP 90 90 88 84 80 82 92  Wt. (Lbs) 223 221 218.25 218 211 211 214.12  BMI 32 31.71 31.32 31.28 30.28 30.71 29.86

## 2017-09-06 NOTE — Assessment & Plan Note (Signed)
Deteriorated. Patient re-educated about  the importance of commitment to a  minimum of 150 minutes of exercise per week.  The importance of healthy food choices with portion control discussed. Encouraged to start a food diary, count calories and to consider  joining a support group. Sample diet sheets offered. Goals set by the patient for the next several months.   Weight /BMI 08/31/2017 05/25/2017 04/16/2017  WEIGHT 223 lb 221 lb 218 lb 4 oz  HEIGHT 5\' 10"  5\' 10"  -  BMI 32 kg/m2 31.71 kg/m2 31.32 kg/m2

## 2017-09-06 NOTE — Assessment & Plan Note (Signed)
Asked : confirms current cigarette smoking Advised: need to quit to reduce risk of heart disease, cancer and dtroke Assess: wants to quit and has set a quit date Assist: education given in writing and discussed re tips to stop and quit line # Arrange f/ in 4 to 5 months Time spent 5 mins of counselling

## 2017-09-06 NOTE — Progress Notes (Signed)
Evan Black     MRN: [id number]      DOB: 10-13-1970   HPI [name] is here for follow up and re-evaluation of chronic medical conditions, medication management and review of any available recent lab and radiology data.  Preventive health is updated, specifically  Cancer screening and Immunization.   Questions or concerns regarding consultations or procedures which the PT has had in the interim are  addressed. The PT denies any adverse reactions to current medications since the last visit.  There are no new concerns.  There are no specific complaints   ROS Denies recent fever or chills. Denies sinus pressure, nasal congestion, ear pain or sore throat. Denies chest congestion, productive cough or wheezing. Denies chest pains, palpitations and leg swelling Denies abdominal pain, nausea, vomiting,diarrhea or constipation.   Denies dysuria, frequency, hesitancy or incontinence. Denies joint pain, swelling and limitation in mobility. Denies headaches, seizures, numbness, or tingling. Denies depression, anxiety or insomnia. Denies skin break down or rash.   PE  BP 140/90   Pulse 76   Resp 16   Ht 5\' 10"  (1.778 m)   Wt 223 lb (101.2 kg)   SpO2 94%   BMI 32.00 kg/m   Patient alert and oriented and in no cardiopulmonary distress.  HEENT: No facial asymmetry, EOMI,   oropharynx pink and moist.  Neck supple no JVD, no mass.  Chest: Clear to auscultation bilaterally.  CVS: S1, S2 no murmurs, no S3.Regular rate.  ABD: Soft non tender.   Ext: No edema  MS: Adequate ROM spine, shoulders, hips and knees.  Skin: Intact, no ulcerations or rash noted.  Psych: Good eye contact, normal affect. Memory intact not anxious or depressed appearing.  CNS: CN 2-12 intact, power,  normal throughout.no focal deficits noted.   Assessment & Plan  Essential hypertension Increased and uncontrolled at visit Spironolactone added, nurse BP check  In 3 weeks DASH diet and commitment to  daily physical activity for a minimum of 30 minutes discussed and encouraged, as a part of hypertension management. The importance of attaining a healthy weight is also discussed.  BP/Weight 08/31/2017 05/25/2017 04/16/2017 01/22/2017 07/21/2016 03/17/2016 8/75/6433  Systolic BP 295 188 416 606 301 601 093  Diastolic BP 90 90 88 84 80 82 92  Wt. (Lbs) 223 221 218.25 218 211 211 214.12  BMI 32 31.71 31.32 31.28 30.28 30.71 29.86       Obesity (BMI 30.0-34.9) Deteriorated. Patient re-educated about  the importance of commitment to a  minimum of 150 minutes of exercise per week.  The importance of healthy food choices with portion control discussed. Encouraged to start a food diary, count calories and to consider  joining a support group. Sample diet sheets offered. Goals set by the patient for the next several months.   Weight /BMI 08/31/2017 05/25/2017 04/16/2017  WEIGHT 223 lb 221 lb 218 lb 4 oz  HEIGHT 5\' 10"  5\' 10"  -  BMI 32 kg/m2 31.71 kg/m2 31.32 kg/m2      Hyperlipidemia with target LDL less than 100 Hyperlipidemia:Low fat diet discussed and encouraged.   Lipid Panel  Lab Results  Component Value Date   CHOL 113 03/07/2017   HDL 26 (L) 03/07/2017   LDLCALC 64 03/07/2017   LDLDIRECT 86 06/01/2014   TRIG 150 (H) 03/07/2017   CHOLHDL 4.3 03/07/2017  needs to increase exercise and reduce fat intake Updated lab needed at/ before next visit. ]  Back pain No current flare, controlled with flexeril,  as needed, which is very infrequently  Nicotine dependence Asked : confirms current cigarette smoking Advised: need to quit to reduce risk of heart disease, cancer and dtroke Assess: wants to quit and has set a quit date Assist: education given in writing and discussed re tips to stop and quit line # Arrange f/ in 4 to 5 months Time spent 5 mins of counselling

## 2017-09-06 NOTE — Assessment & Plan Note (Signed)
Hyperlipidemia:Low fat diet discussed and encouraged.   Lipid Panel  Lab Results  Component Value Date   CHOL 113 03/07/2017   HDL 26 (L) 03/07/2017   LDLCALC 64 03/07/2017   LDLDIRECT 86 06/01/2014   TRIG 150 (H) 03/07/2017   CHOLHDL 4.3 03/07/2017  needs to increase exercise and reduce fat intake Updated lab needed at/ before next visit. ]

## 2017-10-12 ENCOUNTER — Other Ambulatory Visit: Payer: Self-pay | Admitting: Family Medicine

## 2017-11-12 ENCOUNTER — Telehealth: Payer: Self-pay | Admitting: Family Medicine

## 2017-11-12 NOTE — Telephone Encounter (Signed)
lvm to r/s sept appt per r/s list

## 2017-11-17 ENCOUNTER — Other Ambulatory Visit: Payer: Self-pay | Admitting: Family Medicine

## 2017-11-27 ENCOUNTER — Encounter: Payer: Self-pay | Admitting: Family Medicine

## 2018-01-18 ENCOUNTER — Ambulatory Visit (INDEPENDENT_AMBULATORY_CARE_PROVIDER_SITE_OTHER): Payer: BLUE CROSS/BLUE SHIELD | Admitting: Family Medicine

## 2018-01-18 ENCOUNTER — Other Ambulatory Visit: Payer: Self-pay

## 2018-01-18 ENCOUNTER — Encounter: Payer: Self-pay | Admitting: Family Medicine

## 2018-01-18 VITALS — BP 128/80 | HR 64 | Temp 98.5°F | Resp 12 | Ht 72.0 in | Wt 218.1 lb

## 2018-01-18 DIAGNOSIS — I10 Essential (primary) hypertension: Secondary | ICD-10-CM

## 2018-01-18 DIAGNOSIS — E8881 Metabolic syndrome: Secondary | ICD-10-CM

## 2018-01-18 DIAGNOSIS — J209 Acute bronchitis, unspecified: Secondary | ICD-10-CM | POA: Diagnosis not present

## 2018-01-18 DIAGNOSIS — E782 Mixed hyperlipidemia: Secondary | ICD-10-CM

## 2018-01-18 DIAGNOSIS — J0100 Acute maxillary sinusitis, unspecified: Secondary | ICD-10-CM | POA: Diagnosis not present

## 2018-01-18 DIAGNOSIS — E785 Hyperlipidemia, unspecified: Secondary | ICD-10-CM | POA: Diagnosis not present

## 2018-01-18 DIAGNOSIS — E663 Overweight: Secondary | ICD-10-CM

## 2018-01-18 DIAGNOSIS — F17218 Nicotine dependence, cigarettes, with other nicotine-induced disorders: Secondary | ICD-10-CM

## 2018-01-18 MED ORDER — BENZONATATE 100 MG PO CAPS: 100.0000 mg | ORAL_CAPSULE | Freq: Two times a day (BID) | ORAL | 0 refills | Status: DC | PRN

## 2018-01-18 MED ORDER — SULFAMETHOXAZOLE-TRIMETHOPRIM 800-160 MG PO TABS: 1.0000 | ORAL_TABLET | Freq: Two times a day (BID) | ORAL | 0 refills | Status: DC

## 2018-01-18 MED ORDER — LORATADINE 10 MG PO TABS
10.0000 mg | ORAL_TABLET | Freq: Every day | ORAL | 4 refills | Status: DC
Start: 2018-01-18 — End: 2018-05-26

## 2018-01-18 MED ORDER — PROMETHAZINE-DM 6.25-15 MG/5ML PO SYRP: ORAL_SOLUTION | ORAL | 0 refills | Status: DC

## 2018-01-18 NOTE — Patient Instructions (Addendum)
Annual physical exam 12/16 or after call if you need me sooner  Please get at [place] overdue fasting lipid, cmp and eGFR, pSA, CBC and TSH as soon as possible   Yopu are being treated for sinusitis and acute bronchitis , 2 medications are prescribed for 10 days, and bedtime cough syrup   Please commit to daily claritin  And you may flush with  Saline daily and if a lot of drainage  ue sudafed 1 daily (OTC) for a few days

## 2018-01-24 ENCOUNTER — Encounter: Payer: Self-pay | Admitting: Family Medicine

## 2018-01-24 DIAGNOSIS — J209 Acute bronchitis, unspecified: Secondary | ICD-10-CM | POA: Insufficient documentation

## 2018-01-24 DIAGNOSIS — J0100 Acute maxillary sinusitis, unspecified: Secondary | ICD-10-CM | POA: Insufficient documentation

## 2018-01-24 NOTE — Assessment & Plan Note (Signed)
Improved, pt applauded on this. Patient re-educated about  the importance of commitment to a  minimum of 150 minutes of exercise per week.  The importance of healthy food choices with portion control discussed. Encouraged to start a food diary, count calories and to consider  joining a support group. Sample diet sheets offered. Goals set by the patient for the next several months.   Weight /BMI 01/18/2018 08/31/2017 05/25/2017  WEIGHT 218 lb 1.3 oz 223 lb 221 lb  HEIGHT 6\' 0"  5\' 10"  5\' 10"   BMI 29.58 kg/m2 32 kg/m2 31.71 kg/m2

## 2018-01-24 NOTE — Assessment & Plan Note (Addendum)
Asked: confirms current cigarette smoker Assess: unwilling to set a quit date but cutting back Assist: counseled for 3 mins , material provided also, with QUIT NOW #, no interest in medication assistance Advise: need to quit to reduce risk of heart disease, cancer and stroke, also lung disease Arrange : f/u in 3 months

## 2018-01-24 NOTE — Assessment & Plan Note (Signed)
Antibiotic prescribed 

## 2018-01-24 NOTE — Assessment & Plan Note (Signed)
Controlled, no change in medication DASH diet and commitment to daily physical activity for a minimum of 30 minutes discussed and encouraged, as a part of hypertension management. The importance of attaining a healthy weight is also discussed.  BP/Weight 01/18/2018 08/31/2017 05/25/2017 04/16/2017 01/22/2017 07/21/2016 40/09/5911  Systolic BP 685 992 341 443 601 658 006  Diastolic BP 80 90 90 88 84 80 82  Wt. (Lbs) 218.08 223 221 218.25 218 211 211  BMI 29.58 32 31.71 31.32 31.28 30.28 30.71

## 2018-01-24 NOTE — Assessment & Plan Note (Signed)
Hyperlipidemia:Low fat diet discussed and encouraged.   Lipid Panel  Lab Results  Component Value Date   CHOL 113 03/07/2017   HDL 26 (L) 03/07/2017   LDLCALC 64 03/07/2017   LDLDIRECT 86 06/01/2014   TRIG 150 (H) 03/07/2017   CHOLHDL 4.3 03/07/2017   Not at goal  Updated lab needed at/ before next visit.

## 2018-01-24 NOTE — Progress Notes (Signed)
Lydia Meng     MRN: [id number]      DOB: 13-Sep-1970   HPI Mr. Sharman is here c/o 1 week h/o worsening head and chest congestion, associated with fever and chills intermittently. Nasal drainage has thickened , and is yellowish green, and at times bloody. Sputum is thick and yellow. C/o bilateral ear pressure, denies hearing loss and sore throat. Increasing fatigue , poor appetitie and sleep disturbed by cough. No improvement with OTC medication. He has been ill for 3 weeks , and not improveing   Preventive health is updated, specifically  Cancer screening and Immunization.   Questions or concerns regarding consultations or procedures which the PT has had in the interim are  addressed. The PT denies any adverse reactions to current medications since the last visit.  ROS  Denies chest pains, palpitations and leg swelling Denies abdominal pain, nausea, vomiting,diarrhea or constipation.   Denies dysuria, frequency, hesitancy or incontinence. Denies joint pain, swelling and limitation in mobility. Denies headaches, seizures, numbness, or tingling. Denies depression, anxiety or insomnia. Denies skin break down or rash.   PE  BP 128/80   Pulse 64   Temp 98.5 F (36.9 C) (Oral)   Resp 12   Ht 6' (1.829 m)   Wt 218 lb 1.3 oz (98.9 kg)   SpO2 96% Comment: room air  BMI 29.58 kg/m   Patient alert and oriented and in no cardiopulmonary distress.  HEENT: No facial asymmetry, EOMI,   oropharynx pink and moist.  Neck supple no JVD, anterior cervical adenitis bilaterally.Maxillary sinus is tender , nasal mucosa erythematous and edematous, TM clear  Chest: Decreased air entry, scattered crackles, no wheezes  CVS: S1, S2 no murmurs, no S3.Regular rate.  ABD: Soft non tender.   Ext: No edema  MS: Adequate ROM spine, shoulders, hips and knees.  Skin: Intact, no ulcerations or rash noted.  Psych: Good eye contact, normal affect. Memory intact not anxious or depressed  appearing.  CNS: CN 2-12 intact, power,  normal throughout.no focal deficits noted.   Assessment & Plan  Acute bronchitis Antibiotic, decongestant and cough suppressant prescribed  Acute maxillary sinusitis Antibiotic prescribed  Essential hypertension Controlled, no change in medication DASH diet and commitment to daily physical activity for a minimum of 30 minutes discussed and encouraged, as a part of hypertension management. The importance of attaining a healthy weight is also discussed.  BP/Weight 01/18/2018 08/31/2017 05/25/2017 04/16/2017 01/22/2017 07/21/2016 51/0/2585  Systolic BP 277 824 235 361 443 154 008  Diastolic BP 80 90 90 88 84 80 82  Wt. (Lbs) 218.08 223 221 218.25 218 211 211  BMI 29.58 32 31.71 31.32 31.28 30.28 30.71       Overweight (BMI 25.0-29.9) Improved, pt applauded on this. Patient re-educated about  the importance of commitment to a  minimum of 150 minutes of exercise per week.  The importance of healthy food choices with portion control discussed. Encouraged to start a food diary, count calories and to consider  joining a support group. Sample diet sheets offered. Goals set by the patient for the next several months.   Weight /BMI 01/18/2018 08/31/2017 05/25/2017  WEIGHT 218 lb 1.3 oz 223 lb 221 lb  HEIGHT 6\' 0"  5\' 10"  5\' 10"   BMI 29.58 kg/m2 32 kg/m2 31.71 kg/m2      Mixed dyslipidemia Hyperlipidemia:Low fat diet discussed and encouraged.   Lipid Panel  Lab Results  Component Value Date   CHOL 113 03/07/2017   HDL 26 (L)  03/07/2017   LDLCALC 64 03/07/2017   LDLDIRECT 86 06/01/2014   TRIG 150 (H) 03/07/2017   CHOLHDL 4.3 03/07/2017   Not at goal  Updated lab needed at/ before next visit.     Nicotine dependence Asked: confirms current cigarette smoker Assess: unwilling to set a quit date but cutting back Assist: counseled for 3 mins , material provided also, with QUIT NOW #, no interest in medication assistance Advise: need  to quit to reduce risk of heart disease, cancer and stroke, also lung disease Arrange : f/u in 3 months

## 2018-01-24 NOTE — Assessment & Plan Note (Signed)
Antibiotic , decongestant and cough suppressant prescribed 

## 2018-01-26 ENCOUNTER — Ambulatory Visit (INDEPENDENT_AMBULATORY_CARE_PROVIDER_SITE_OTHER): Payer: BLUE CROSS/BLUE SHIELD | Admitting: Family Medicine

## 2018-01-26 ENCOUNTER — Ambulatory Visit (HOSPITAL_COMMUNITY)
Admission: RE | Admit: 2018-01-26 | Discharge: 2018-01-26 | Disposition: A | Discharge status: Home / Self Care | Payer: BLUE CROSS/BLUE SHIELD | Source: Ambulatory Visit | Attending: Family Medicine | Admitting: Family Medicine

## 2018-01-26 ENCOUNTER — Telehealth: Payer: Self-pay | Admitting: Family Medicine

## 2018-01-26 ENCOUNTER — Other Ambulatory Visit (HOSPITAL_COMMUNITY)
Admission: RE | Admit: 2018-01-26 | Discharge: 2018-01-26 | Disposition: A | Discharge status: Home / Self Care | Payer: BLUE CROSS/BLUE SHIELD | Attending: Family Medicine | Admitting: Family Medicine

## 2018-01-26 ENCOUNTER — Other Ambulatory Visit: Payer: Self-pay | Admitting: Family Medicine

## 2018-01-26 ENCOUNTER — Encounter: Payer: Self-pay | Admitting: Family Medicine

## 2018-01-26 VITALS — BP 118/78 | HR 70 | Temp 98.5°F | Resp 16 | Ht 72.0 in | Wt 224.0 lb

## 2018-01-26 DIAGNOSIS — I10 Essential (primary) hypertension: Secondary | ICD-10-CM

## 2018-01-26 DIAGNOSIS — J209 Acute bronchitis, unspecified: Secondary | ICD-10-CM | POA: Diagnosis present

## 2018-01-26 DIAGNOSIS — F17218 Nicotine dependence, cigarettes, with other nicotine-induced disorders: Secondary | ICD-10-CM

## 2018-01-26 DIAGNOSIS — J0100 Acute maxillary sinusitis, unspecified: Secondary | ICD-10-CM

## 2018-01-26 LAB — CBC WITH DIFFERENTIAL/PLATELET
BASOS ABS: 0 10*3/uL (ref 0.0–0.1)
BASOS PCT: 0 %
EOS PCT: 3 %
Eosinophils Absolute: 0.3 10*3/uL (ref 0.0–0.7)
HCT: 39.9 % (ref 39.0–52.0)
Hemoglobin: 13.3 g/dL (ref 13.0–17.0)
LYMPHS PCT: 40 %
Lymphs Abs: 3.4 10*3/uL (ref 0.7–4.0)
MCH: 30 pg (ref 26.0–34.0)
MCHC: 33.3 g/dL (ref 30.0–36.0)
MCV: 89.9 fL (ref 78.0–100.0)
MONO ABS: 0.7 10*3/uL (ref 0.1–1.0)
Monocytes Relative: 8 %
NEUTROS ABS: 4 10*3/uL (ref 1.7–7.7)
Neutrophils Relative %: 49 %
PLATELETS: 287 10*3/uL (ref 150–400)
RBC: 4.44 MIL/uL (ref 4.22–5.81)
RDW: 13.8 % (ref 11.5–15.5)
WBC: 8.4 10*3/uL (ref 4.0–10.5)

## 2018-01-26 LAB — BASIC METABOLIC PANEL
ANION GAP: 7 (ref 5–15)
BUN: 12 mg/dL (ref 6–20)
CALCIUM: 9.3 mg/dL (ref 8.9–10.3)
CO2: 26 mmol/L (ref 22–32)
Chloride: 106 mmol/L (ref 98–111)
Creatinine, Ser: 1.07 mg/dL (ref 0.61–1.24)
GFR calc Af Amer: >60 mL/min (ref >60)
GLUCOSE: 119 mg/dL — AB (ref 70–99)
POTASSIUM: 3.8 mmol/L (ref 3.5–5.1)
SODIUM: 139 mmol/L (ref 135–145)

## 2018-01-26 NOTE — Addendum Note (Signed)
Addended by: Eual Fines on: 01/26/2018 03:30 PM   Modules accepted: Orders

## 2018-01-26 NOTE — Patient Instructions (Addendum)
Please keep f/u as before, call if you need me sooner  Labs today, stat at hospital cbc and diff and chem  7 and EGFR only  Xrays today at the hospital CXR and sinus Xray.  Work excuse for today to return tomorrow if labs and x rays are fine. If not you will need to be out for the rest of the week  I will contact you this afternoon re test results by sending a message to your wife  Should be available by 7 pm  Fluids , rest , medication, don't overdo work!!  Thank you  for choosing [facility]. We consider it a privelige to serve you.  Delivering excellent health care in a caring and  compassionate way is our goal.  Partnering with you,  so that together we can achieve this goal is our strategy.

## 2018-01-26 NOTE — Telephone Encounter (Signed)
pls see lab orders needed for pt to go to lab today, they are in message from his wife Evan Black in a note I sent back to her Pls  Enter blood and sputum culture orders DX id sinusitis and bronchitis, thanks! Call/ contact pt/ spouse when orders are sent so he can go to the lab I entered and messaged already re Xr ays Thanks

## 2018-01-26 NOTE — Progress Notes (Signed)
Dg cXR and sinus x ray ordered

## 2018-01-27 NOTE — Progress Notes (Signed)
lvm 9/21

## 2018-01-27 NOTE — Telephone Encounter (Signed)
Done

## 2018-01-30 ENCOUNTER — Encounter: Payer: Self-pay | Admitting: Family Medicine

## 2018-01-30 NOTE — Assessment & Plan Note (Signed)
Smoking cessation counseling done briefly for 3 mins,nno quit date set

## 2018-01-30 NOTE — Assessment & Plan Note (Signed)
Encouraged to increase rest , complete antibiotic course and use saline nasal flushes

## 2018-01-30 NOTE — Assessment & Plan Note (Signed)
Deterioration in course reported despite antibiotics. CXR and work excuse x 1 day, will prolong if pt has pneumonia, and basic lab data to be obtained

## 2018-01-30 NOTE — Progress Notes (Signed)
   Evan Black     MRN: [id number]      DOB: December 13, 1970   HPI Evan Black is here for follow up as his wife called in stating that she was concerned as her husband was unable to work because of  Night sweats and cough  And fevre, felt as though he was deteriorating rather than improving from recent treatment for acute sinusitis and bronchitis. Evan Black himself states he just feels as though he is "tired" from over exertion  ROS  Denies chest pains, palpitations and leg swelling Denies abdominal pain, nausea, vomiting,diarrhea or constipation.   Denies dysuria, frequency, hesitancy or incontinence. Denies joint pain, swelling and limitation in mobility. Denies headaches, seizures, numbness, or tingling. Denies depression, anxiety or insomnia. Denies skin break down or rash.   PE  BP 118/78   Pulse 70   Temp 98.5 F (36.9 C) (Oral)   Resp 16   Ht 6' (1.829 m)   Wt 224 lb (101.6 kg)   SpO2 97%   BMI 30.38 kg/m   Patient alert and oriented and in no cardiopulmonary distress.  HEENT: No facial asymmetry, EOMI,   oropharynx pink and moist.  Neck supple no JVD, no mass.Maxillary sinus tenderness, TM clear  Chest: Adequate air entry throughout, no wheezes, few crackles in bases CVS: S1, S2 no murmurs, no S3.Regular rate.  ABD: Soft non tender.   Ext: No edema  MS: Adequate ROM spine, shoulders, hips and knees.  Skin: Intact, no ulcerations or rash noted.  Psych: Good eye contact, normal affect. Memory intact not anxious or depressed appearing.  CNS: CN 2-12 intact, power,  normal throughout.no focal deficits noted.   Assessment & Plan  Acute bronchitis Deterioration in course reported despite antibiotics. CXR and work excuse x 1 day, will prolong if Evan Black has pneumonia, and basic lab data to be obtained  Acute maxillary sinusitis Encouraged to increase rest , complete antibiotic course and use saline nasal flushes   Nicotine dependence Smoking cessation counseling done  briefly for 3 mins,nno quit date set  Essential hypertension Controlled, no change in medication DASH diet and commitment to daily physical activity for a minimum of 30 minutes discussed and encouraged, as a part of hypertension management. The importance of attaining a healthy weight is also discussed.  BP/Weight 01/26/2018 01/18/2018 08/31/2017 05/25/2017 04/16/2017 01/22/2017 07/11/5425  Systolic BP 062 376 283 151 761 607 371  Diastolic BP 78 80 90 90 88 84 80  Wt. (Lbs) 224 218.08 223 221 218.25 218 211  BMI 30.38 29.58 32 31.71 31.32 31.28 30.28

## 2018-01-30 NOTE — Assessment & Plan Note (Signed)
Controlled, no change in medication DASH diet and commitment to daily physical activity for a minimum of 30 minutes discussed and encouraged, as a part of hypertension management. The importance of attaining a healthy weight is also discussed.  BP/Weight 01/26/2018 01/18/2018 08/31/2017 05/25/2017 04/16/2017 01/22/2017 08/13/3541  Systolic BP 014 840 397 953 692 230 097  Diastolic BP 78 80 90 90 88 84 80  Wt. (Lbs) 224 218.08 223 221 218.25 218 211  BMI 30.38 29.58 32 31.71 31.32 31.28 30.28

## 2018-03-04 ENCOUNTER — Ambulatory Visit: Payer: BLUE CROSS/BLUE SHIELD | Admitting: Family Medicine

## 2018-03-31 ENCOUNTER — Other Ambulatory Visit: Payer: Self-pay | Admitting: Family Medicine

## 2018-05-26 ENCOUNTER — Encounter: Payer: Self-pay | Admitting: Family Medicine

## 2018-05-26 ENCOUNTER — Ambulatory Visit (INDEPENDENT_AMBULATORY_CARE_PROVIDER_SITE_OTHER): Payer: BLUE CROSS/BLUE SHIELD | Admitting: Family Medicine

## 2018-05-26 VITALS — BP 148/70 | HR 84 | Resp 12 | Ht 70.0 in | Wt 220.0 lb

## 2018-05-26 DIAGNOSIS — F17218 Nicotine dependence, cigarettes, with other nicotine-induced disorders: Secondary | ICD-10-CM | POA: Diagnosis not present

## 2018-05-26 DIAGNOSIS — E669 Obesity, unspecified: Secondary | ICD-10-CM

## 2018-05-26 DIAGNOSIS — Z0000 Encounter for general adult medical examination without abnormal findings: Secondary | ICD-10-CM

## 2018-05-26 DIAGNOSIS — I10 Essential (primary) hypertension: Secondary | ICD-10-CM | POA: Diagnosis not present

## 2018-05-26 DIAGNOSIS — R7303 Prediabetes: Secondary | ICD-10-CM

## 2018-05-26 DIAGNOSIS — Z23 Encounter for immunization: Secondary | ICD-10-CM

## 2018-05-26 DIAGNOSIS — E785 Hyperlipidemia, unspecified: Secondary | ICD-10-CM

## 2018-05-26 DIAGNOSIS — Z125 Encounter for screening for malignant neoplasm of prostate: Secondary | ICD-10-CM

## 2018-05-26 DIAGNOSIS — E8881 Metabolic syndrome: Secondary | ICD-10-CM

## 2018-05-26 MED ORDER — CYCLOBENZAPRINE HCL 10 MG PO TABS: ORAL_TABLET | ORAL | 0 refills | Status: DC

## 2018-05-26 MED ORDER — AMLODIPINE BESYLATE 10 MG PO TABS: 10.0000 mg | ORAL_TABLET | Freq: Every day | ORAL | 5 refills | Status: DC

## 2018-05-26 NOTE — Addendum Note (Signed)
Addended by: Eual Fines on: 05/26/2018 03:40 PM   Modules accepted: Orders

## 2018-05-26 NOTE — Progress Notes (Signed)
   Evan Black     MRN: [id number]      DOB: 1971-01-20   HPI: Patient is in for annual physical exam. Uncontrolled blood pressure is addressed Immunization is reviewed , and  updated if needed.    PE; BP (!) 148/70 (BP Location: Left Arm, Patient Position: Sitting, Cuff Size: Large)   Pulse 84   Resp 12   Ht 5\' 10"  (1.778 m)   Wt 220 lb 0.6 oz (99.8 kg)   SpO2 96% Comment: room air  BMI 31.57 kg/m   Pleasant male, alert and oriented x 3, in no cardio-pulmonary distress. Afebrile. HEENT No facial trauma or asymetry. Sinuses non tender. EOMIExternal ears normal, tympanic membranes clear. Oropharynx moist, no exudate. Neck: supple, no adenopathy,JVD or thyromegaly.No bruits.  Chest: Clear to ascultation bilaterally.No crackles or wheezes. Non tender to palpation  Cardiovascular system; Heart sounds normal,  S1 and  S2 ,no S3.  No murmur, or thrill. Apical beat not displaced Peripheral pulses normal.  Abdomen: Soft, non tender, no organomegaly or masses. No bruits. Bowel sounds normal. No guarding, tenderness or rebound.    Musculoskeletal exam: Full ROM of spine, hips , shoulders and knees. No deformity ,swelling or crepitus noted. No muscle wasting or atrophy.   Neurologic: Cranial nerves 2 to 12 intact. Power, tone ,sensation and reflexes normal throughout. No disturbance in gait. No tremor.  Skin: Intact, no ulceration, erythema , scaling or rash noted. Pigmentation normal throughout  Psych; Normal mood and affect. Judgement and concentration normal   Assessment & Plan:  Annual physical exam Annual exam as documented. Counseling done  re healthy lifestyle involving commitment to 150 minutes exercise per week, heart healthy diet, and attaining healthy weight.The importance of adequate sleep also discussed. Changes in health habits are decided on by the patient with goals and time frames  set for achieving them. Immunization and cancer screening  needs are specifically addressed at this visit.   Nicotine dependence Asked:confirms currently smokes  2 to 3 cigarettes/ day Assess: wanting to quit but will not set a date Advise: needs to QUIT to reduce risk of cancer, cardio and cerebrovascular disease Assist: counseled for 5 minutes and literature provided Arrange: follow up in 3 months   Obesity (BMI 30.0-34.9) Improved Patient re-educated about  the importance of commitment to a  minimum of 150 minutes of exercise per week.  The importance of healthy food choices with portion control discussed. Encouraged to start a food diary, count calories and to consider  joining a support group. Sample diet sheets offered. Goals set by the patient for the next several months.   Weight /BMI 05/26/2018 01/26/2018 01/18/2018  WEIGHT 220 lb 0.6 oz 224 lb 218 lb 1.3 oz  HEIGHT 5\' 10"  6\' 0"  6\' 0"   BMI 31.57 kg/m2 30.38 kg/m2 29.58 kg/m2      Essential hypertension Uncontrolled, increase amlodipine dose to 10 mg daily DASH diet and commitment to daily physical activity for a minimum of 30 minutes discussed and encouraged, as a part of hypertension management. The importance of attaining a healthy weight is also discussed.  BP/Weight 05/26/2018 01/26/2018 01/18/2018 08/31/2017 05/25/2017 04/16/2017 08/20/9700  Systolic BP 637 858 850 277 412 878 676  Diastolic BP 70 78 80 90 90 88 84  Wt. (Lbs) 220.04 224 218.08 223 221 218.25 218  BMI 31.57 30.38 29.58 32 31.71 31.32 31.28

## 2018-05-26 NOTE — Patient Instructions (Addendum)
F/U in 6 weeks, EKG at this  visit,call if you need me before  Flu vaccine today  Dose increase in amlodipine to 10 mg one dailty, blood pressure is high, continue spironolactone as before  Needs to STOP smoking , to become healthy and reduce risk of cancer, stroke and heart disease  Need fasting labs previously ordered the first week ion January ( do not re order the cBC, does not need that)      Steps to Quit Smoking  Smoking tobacco can be bad for your health. It can also affect almost every organ in your body. Smoking puts you and people around you at risk for many serious long-lasting (chronic) diseases. Quitting smoking is hard, but it is one of the best things that you can do for your health. It is never too late to quit. What are the benefits of quitting smoking? When you quit smoking, you lower your risk for getting serious diseases and conditions. They can include:  Lung cancer or lung disease.  Heart disease.  Stroke.  Heart attack.  Not being able to have children (infertility).  Weak bones (osteoporosis) and broken bones (fractures). If you have coughing, wheezing, and shortness of breath, those symptoms may get better when you quit. You may also get sick less often. If you are pregnant, quitting smoking can help to lower your chances of having a baby of low birth weight. What can I do to help me quit smoking? Talk with your doctor about what can help you quit smoking. Some things you can do (strategies) include:  Quitting smoking totally, instead of slowly cutting back how much you smoke over a period of time.  Going to in-person counseling. You are more likely to quit if you go to many counseling sessions.  Using resources and support systems, such as: ? Database administrator with a Social worker. ? Phone quitlines. ? Careers information officer. ? Support groups or group counseling. ? Text messaging programs. ? Mobile phone apps or applications.  Taking medicines.  Some of these medicines may have nicotine in them. If you are pregnant or breastfeeding, do not take any medicines to quit smoking unless your doctor says it is okay. Talk with your doctor about counseling or other things that can help you. Talk with your doctor about using more than one strategy at the same time, such as taking medicines while you are also going to in-person counseling. This can help make quitting easier. What things can I do to make it easier to quit? Quitting smoking might feel very hard at first, but there is a lot that you can do to make it easier. Take these steps:  Talk to your family and friends. Ask them to support and encourage you.  Call phone quitlines, reach out to support groups, or work with a Social worker.  Ask people who smoke to not smoke around you.  Avoid places that make you want (trigger) to smoke, such as: ? Bars. ? Parties. ? Smoke-break areas at work.  Spend time with people who do not smoke.  Lower the stress in your life. Stress can make you want to smoke. Try these things to help your stress: ? Getting regular exercise. ? Deep-breathing exercises. ? Yoga. ? Meditating. ? Doing a body scan. To do this, close your eyes, focus on one area of your body at a time from head to toe, and notice which parts of your body are tense. Try to relax the muscles in those areas.  Download or buy apps on your mobile phone or tablet that can help you stick to your quit plan. There are many free apps, such as QuitGuide from the [employer] Office manager for Disease Control and Prevention). You can find more support from smokefree.gov and other websites. This information is not intended to replace advice given to you by your health care provider. Make sure you discuss any questions you have with your health care provider. Document Released: 03/22/2009 Document Revised: 01/22/2016 Document Reviewed: 10/10/2014 Elsevier Interactive Patient Education  2019 [employer].

## 2018-05-28 ENCOUNTER — Encounter: Payer: Self-pay | Admitting: Family Medicine

## 2018-05-28 NOTE — Assessment & Plan Note (Signed)
Uncontrolled, increase amlodipine dose to 10 mg daily DASH diet and commitment to daily physical activity for a minimum of 30 minutes discussed and encouraged, as a part of hypertension management. The importance of attaining a healthy weight is also discussed.  BP/Weight 05/26/2018 01/26/2018 01/18/2018 08/31/2017 05/25/2017 04/16/2017 0/98/1191  Systolic BP 478 295 621 308 657 846 962  Diastolic BP 70 78 80 90 90 88 84  Wt. (Lbs) 220.04 224 218.08 223 221 218.25 218  BMI 31.57 30.38 29.58 32 31.71 31.32 31.28

## 2018-05-28 NOTE — Assessment & Plan Note (Signed)
Annual exam as documented. Counseling done  re healthy lifestyle involving commitment to 150 minutes exercise per week, heart healthy diet, and attaining healthy weight.The importance of adequate sleep also discussed. Changes in health habits are decided on by the patient with goals and time frames  set for achieving them. Immunization and cancer screening needs are specifically addressed at this visit. 

## 2018-05-28 NOTE — Assessment & Plan Note (Signed)
Improved Patient re-educated about  the importance of commitment to a  minimum of 150 minutes of exercise per week.  The importance of healthy food choices with portion control discussed. Encouraged to start a food diary, count calories and to consider  joining a support group. Sample diet sheets offered. Goals set by the patient for the next several months.   Weight /BMI 05/26/2018 01/26/2018 01/18/2018  WEIGHT 220 lb 0.6 oz 224 lb 218 lb 1.3 oz  HEIGHT 5\' 10"  6\' 0"  6\' 0"   BMI 31.57 kg/m2 30.38 kg/m2 29.58 kg/m2

## 2018-05-28 NOTE — Assessment & Plan Note (Signed)
Asked:confirms currently smokes  2 to 3 cigarettes/ day Assess: wanting to quit but will not set a date Advise: needs to QUIT to reduce risk of cancer, cardio and cerebrovascular disease Assist: counseled for 5 minutes and literature provided Arrange: follow up in 3 months

## 2018-07-13 ENCOUNTER — Other Ambulatory Visit: Payer: Self-pay | Admitting: Family Medicine

## 2018-08-17 ENCOUNTER — Ambulatory Visit: Payer: BLUE CROSS/BLUE SHIELD | Admitting: Family Medicine

## 2018-08-20 ENCOUNTER — Other Ambulatory Visit: Payer: Self-pay | Admitting: Family Medicine

## 2018-09-06 ENCOUNTER — Encounter: Payer: Self-pay | Admitting: Family Medicine

## 2018-09-06 ENCOUNTER — Ambulatory Visit (INDEPENDENT_AMBULATORY_CARE_PROVIDER_SITE_OTHER): Payer: BLUE CROSS/BLUE SHIELD | Admitting: Family Medicine

## 2018-09-06 DIAGNOSIS — E669 Obesity, unspecified: Secondary | ICD-10-CM

## 2018-09-06 DIAGNOSIS — F17218 Nicotine dependence, cigarettes, with other nicotine-induced disorders: Secondary | ICD-10-CM

## 2018-09-06 DIAGNOSIS — E785 Hyperlipidemia, unspecified: Secondary | ICD-10-CM

## 2018-09-06 DIAGNOSIS — I10 Essential (primary) hypertension: Secondary | ICD-10-CM

## 2018-09-06 DIAGNOSIS — R7303 Prediabetes: Secondary | ICD-10-CM

## 2018-09-06 MED ORDER — CYCLOBENZAPRINE HCL 10 MG PO TABS: ORAL_TABLET | ORAL | 1 refills | Status: DC

## 2018-09-06 MED ORDER — ATORVASTATIN CALCIUM 10 MG PO TABS: 10.0000 mg | ORAL_TABLET | Freq: Every day | ORAL | 5 refills | Status: DC

## 2018-09-06 MED ORDER — SPIRONOLACTONE 25 MG PO TABS: 25.0000 mg | ORAL_TABLET | Freq: Every day | ORAL | 5 refills | Status: DC

## 2018-09-06 MED ORDER — AMLODIPINE BESYLATE 10 MG PO TABS: 10.0000 mg | ORAL_TABLET | Freq: Every day | ORAL | 5 refills | Status: DC

## 2018-09-06 NOTE — Assessment & Plan Note (Signed)
Uncontrolled, non compliant with medications on review, 'mis understood, will collect medications today and start taking  Needs OV for BP eval in 6 weeks, needs labs for that visit also already past due DASH diet and commitment to daily physical activity for a minimum of 30 minutes discussed and encouraged, as a part of hypertension management. The importance of attaining a healthy weight is also discussed.  BP/Weight 05/26/2018 01/26/2018 01/18/2018 08/31/2017 05/25/2017 04/16/2017 2/83/1517  Systolic BP 616 073 710 626 948 546 270  Diastolic BP 70 78 80 90 90 88 84  Wt. (Lbs) 220.04 224 218.08 223 221 218.25 218  BMI 31.57 30.38 29.58 32 31.71 31.32 31.28

## 2018-09-06 NOTE — Patient Instructions (Signed)
F/U in 6 weeks , needs BP check in the office at that time, late appointment need to take TWO blood pressure meds, amlodipine 10 mg and spironolactone 25 mg EVERY day One cholesterol medication crestor every evening  Medications are at your pharmacy, let us know if a problem  Please get fasting labs as ordered in December 1 week before your next visit, printed lab order is also mailerd  Now down to 1 cigarettes intermittently, PLEASE DO QUIT, you are almost there!  It is important that you exercise regularly at least 30 minutes 5 times a week. If you develop chest pain, have severe difficulty breathing, or feel very tired, stop exercising immediately and seek medical attention    Social distancing. Frequent hand washing with soap and water Keeping your hands off of your face. These 3 practices will help to keep both you and your community healthy during this time. Please practice them faithfully!

## 2018-09-06 NOTE — Assessment & Plan Note (Signed)
   Patient re-educated about  the importance of commitment to a  minimum of 150 minutes of exercise per week as able.  The importance of healthy food choices with portion control discussed, as well as eating regularly and within a 12 hour window most days. The need to choose "clean , green" food 50 to 75% of the time is discussed, as well as to make water the primary drink and set a goal of 64 ounces water daily.  Encouraged to start a food diary,  and to consider  joining a support group. Sample diet sheets offered. Goals set by the patient for the next several months.   Weight /BMI 05/26/2018 01/26/2018 01/18/2018  WEIGHT 220 lb 0.6 oz 224 lb 218 lb 1.3 oz  HEIGHT 5\' 10"  6\' 0"  6\' 0"   BMI 31.57 kg/m2 30.38 kg/m2 29.58 kg/m2

## 2018-09-06 NOTE — Assessment & Plan Note (Signed)
Hyperlipidemia:Low fat diet discussed and encouraged.   Lipid Panel  Lab Results  Component Value Date   CHOL 113 03/07/2017   HDL 26 (L) 03/07/2017   LDLCALC 64 03/07/2017   LDLDIRECT 86 06/01/2014   TRIG 150 (H) 03/07/2017   CHOLHDL 4.3 03/07/2017   Updated lab needed at/ before next visit. Uncontrolled

## 2018-09-06 NOTE — Progress Notes (Signed)
Virtual Visit via Telephone Note  I connected with Evan Black on 09/06/18 at  3:40 PM EDT by telephone and verified that I am speaking with the correct person using two identifiers.   I discussed the limitations, risks, security and privacy concerns of performing an evaluation and management service by telephone and the availability of in person appointments. I also discussed with the patient that there may be a patient responsible charge related to this service. The patient expressed understanding and agreed to proceed. I am speaking with patient from the office he is in his car   History of Present Illness: Denies recent fever or chills. Denies sinus pressure, nasal congestion, ear pain or sore throat. Denies chest congestion, productive cough or wheezing. Denies chest pains, palpitations and leg swelling Denies abdominal pain, nausea, vomiting,diarrhea or constipation.   Denies dysuria, frequency, hesitancy or incontinence. Denies joint pain, swelling and limitation in mobility. Denies headaches, seizures, numbness, or tingling. Denies depression, anxiety or insomnia. Denies skin break down or rash.       Observations/Objective:   Assessment and Plan:  Essential hypertension Uncontrolled, non compliant with medications on review, 'mis understood, will collect medications today and start taking  Needs OV for BP eval in 6 weeks, needs labs for that visit also already past due DASH diet and commitment to daily physical activity for a minimum of 30 minutes discussed and encouraged, as a part of hypertension management. The importance of attaining a healthy weight is also discussed.  BP/Weight 05/26/2018 01/26/2018 01/18/2018 08/31/2017 05/25/2017 04/16/2017 6/62/9476  Systolic BP 546 503 546 568 127 517 001  Diastolic BP 70 78 80 90 90 88 84  Wt. (Lbs) 220.04 224 218.08 223 221 218.25 218  BMI 31.57 30.38 29.58 32 31.71 31.32 31.28       Nicotine dependence Asked:confirms  currently smokes about 1  Cigarette per week Assess: willing to quit and  cutting back Advise: needs to QUIT to reduce risk of cancer, cardio and cerebrovascular disease Assist: counseled for 5 minutes and literature provided Arrange: follow up in 3 months   Obesity (BMI 30.0-34.9)   Patient re-educated about  the importance of commitment to a  minimum of 150 minutes of exercise per week as able.  The importance of healthy food choices with portion control discussed, as well as eating regularly and within a 12 hour window most days. The need to choose "clean , green" food 50 to 75% of the time is discussed, as well as to make water the primary drink and set a goal of 64 ounces water daily.  Encouraged to start a food diary,  and to consider  joining a support group. Sample diet sheets offered. Goals set by the patient for the next several months.   Weight /BMI 05/26/2018 01/26/2018 01/18/2018  WEIGHT 220 lb 0.6 oz 224 lb 218 lb 1.3 oz  HEIGHT 5\' 10"  6\' 0"  6\' 0"   BMI 31.57 kg/m2 30.38 kg/m2 29.58 kg/m2      Hyperlipidemia with target LDL less than 100 Hyperlipidemia:Low fat diet discussed and encouraged.   Lipid Panel  Lab Results  Component Value Date   CHOL 113 03/07/2017   HDL 26 (L) 03/07/2017   LDLCALC 64 03/07/2017   LDLDIRECT 86 06/01/2014   TRIG 150 (H) 03/07/2017   CHOLHDL 4.3 03/07/2017   Updated lab needed at/ before next visit. Uncontrolled     Prediabetes Patient educated about the importance of limiting  Carbohydrate intake , the need to commit to  daily physical activity for a minimum of 30 minutes , and to commit weight loss. The fact that changes in all these areas will reduce or eliminate all together the development of diabetes is stressed.   Diabetic Labs Latest Ref Rng & Units 01/26/2018 03/07/2017 03/14/2016 03/31/2015 06/01/2014  HbA1c <5.7 % of total Hgb - 5.6 5.7(H) 5.9(H) 6.1(H)  Chol <200 mg/dL - 113 161 169 184  HDL >40 mg/dL - 26(L) 24(L)  24(L) 22(L)  Calc LDL mg/dL (calc) - 64 85 97 NOT CALC  Triglycerides <150 mg/dL - 150(H) 259(H) 239(H) 435(H)  Creatinine 0.61 - 1.24 mg/dL 1.07 0.90 0.95 0.91 0.95   BP/Weight 05/26/2018 01/26/2018 01/18/2018 08/31/2017 05/25/2017 04/16/2017 08/07/6008  Systolic BP 932 355 732 202 542 706 237  Diastolic BP 70 78 80 90 90 88 84  Wt. (Lbs) 220.04 224 218.08 223 221 218.25 218  BMI 31.57 30.38 29.58 32 31.71 31.32 31.28   No flowsheet data found.     Follow Up Instructions:    I discussed the assessment and treatment plan with the patient. The patient was provided an opportunity to ask questions and all were answered. The patient agreed with the plan and demonstrated an understanding of the instructions.   The patient was advised to call back or seek an in-person evaluation if the symptoms worsen or if the condition fails to improve as anticipated.  I provided 22 minutes of non-face-to-face time during this encounter.   Tula Nakayama, MD

## 2018-09-06 NOTE — Assessment & Plan Note (Signed)
Patient educated about the importance of limiting  Carbohydrate intake , the need to commit to daily physical activity for a minimum of 30 minutes , and to commit weight loss. The fact that changes in all these areas will reduce or eliminate all together the development of diabetes is stressed.   Diabetic Labs Latest Ref Rng & Units 01/26/2018 03/07/2017 03/14/2016 03/31/2015 06/01/2014  HbA1c <5.7 % of total Hgb - 5.6 5.7(H) 5.9(H) 6.1(H)  Chol <200 mg/dL - 113 161 169 184  HDL >40 mg/dL - 26(L) 24(L) 24(L) 22(L)  Calc LDL mg/dL (calc) - 64 85 97 NOT CALC  Triglycerides <150 mg/dL - 150(H) 259(H) 239(H) 435(H)  Creatinine 0.61 - 1.24 mg/dL 1.07 0.90 0.95 0.91 0.95   BP/Weight 05/26/2018 01/26/2018 01/18/2018 08/31/2017 05/25/2017 04/16/2017 6/57/9038  Systolic BP 333 832 919 166 060 045 997  Diastolic BP 70 78 80 90 90 88 84  Wt. (Lbs) 220.04 224 218.08 223 221 218.25 218  BMI 31.57 30.38 29.58 32 31.71 31.32 31.28   No flowsheet data found.

## 2018-09-06 NOTE — Assessment & Plan Note (Signed)
Asked:confirms currently smokes about 1  Cigarette per week Assess: willing to quit and  cutting back Advise: needs to QUIT to reduce risk of cancer, cardio and cerebrovascular disease Assist: counseled for 5 minutes and literature provided Arrange: follow up in 3 months

## 2018-10-19 ENCOUNTER — Ambulatory Visit: Payer: BLUE CROSS/BLUE SHIELD | Admitting: Family Medicine

## 2018-11-03 ENCOUNTER — Ambulatory Visit: Payer: BLUE CROSS/BLUE SHIELD | Admitting: Family Medicine

## 2018-11-24 ENCOUNTER — Encounter: Payer: Self-pay | Admitting: Family Medicine

## 2018-11-24 ENCOUNTER — Ambulatory Visit (INDEPENDENT_AMBULATORY_CARE_PROVIDER_SITE_OTHER): Payer: BC Managed Care – PPO | Admitting: Family Medicine

## 2018-11-24 ENCOUNTER — Other Ambulatory Visit: Payer: Self-pay

## 2018-11-24 VITALS — BP 126/78 | HR 80 | Temp 98.7°F | Resp 15 | Ht 70.0 in | Wt 230.0 lb

## 2018-11-24 DIAGNOSIS — Z125 Encounter for screening for malignant neoplasm of prostate: Secondary | ICD-10-CM

## 2018-11-24 DIAGNOSIS — I10 Essential (primary) hypertension: Secondary | ICD-10-CM

## 2018-11-24 DIAGNOSIS — E782 Mixed hyperlipidemia: Secondary | ICD-10-CM | POA: Diagnosis not present

## 2018-11-24 DIAGNOSIS — E8881 Metabolic syndrome: Secondary | ICD-10-CM

## 2018-11-24 DIAGNOSIS — E785 Hyperlipidemia, unspecified: Secondary | ICD-10-CM

## 2018-11-24 DIAGNOSIS — E66811 Obesity, class 1: Secondary | ICD-10-CM

## 2018-11-24 DIAGNOSIS — R7303 Prediabetes: Secondary | ICD-10-CM | POA: Diagnosis not present

## 2018-11-24 DIAGNOSIS — E669 Obesity, unspecified: Secondary | ICD-10-CM

## 2018-11-24 DIAGNOSIS — M5440 Lumbago with sciatica, unspecified side: Secondary | ICD-10-CM

## 2018-11-24 MED ORDER — PHENTERMINE HCL 37.5 MG PO TABS: ORAL_TABLET | ORAL | 1 refills | Status: DC

## 2018-11-24 NOTE — Patient Instructions (Signed)
F/U in 3.5 month to 16 weeks, call if you need me sooner  Blood pressure is very good, continue currrent medication  Labs today, CBC, pSA, lipid, cmp and eGFr, PSA, HBA1C, and TSH ( [facility])  Please get rid of all unhealthy snacks at home and do not by them  ONLY healthy food is fruit ,  Vegetables, seafood, white meats, may  Have burger or a steak once weekly  PROBLEM is the sugar and starchy foods like potato , rice , pasta , biscuits   New is half phentermine every morning  Weight loss goal of 3 to 4 pounds per month  MIND ISSUE, PUT YOUR MIND to IT   It is important that you exercise regularly at least 30 minutes 5 times a week. If you develop chest pain, have severe difficulty breathing, or feel very tired, stop exercising immediately and seek medical attention

## 2018-11-25 ENCOUNTER — Encounter: Payer: Self-pay | Admitting: Family Medicine

## 2018-11-25 LAB — CBC
HCT: 38.9 % (ref 38.5–50.0)
Hemoglobin: 13.5 g/dL (ref 13.2–17.1)
MCH: 29.7 pg (ref 27.0–33.0)
MCHC: 34.7 g/dL (ref 32.0–36.0)
MCV: 85.5 fL (ref 80.0–100.0)
MPV: 10.2 fL (ref 7.5–12.5)
Platelets: 309 10*3/uL (ref 140–400)
RBC: 4.55 10*6/uL (ref 4.20–5.80)
RDW: 13.4 % (ref 11.0–15.0)
WBC: 9.4 10*3/uL (ref 3.8–10.8)

## 2018-11-25 LAB — COMPLETE METABOLIC PANEL WITH GFR
AG Ratio: 1.7 (calc) (ref 1.0–2.5)
ALT: 27 U/L (ref 9–46)
AST: 22 U/L (ref 10–40)
Albumin: 4.3 g/dL (ref 3.6–5.1)
Alkaline phosphatase (APISO): 49 U/L (ref 36–130)
BUN: 13 mg/dL (ref 7–25)
CO2: 28 mmol/L (ref 20–32)
Calcium: 9.5 mg/dL (ref 8.6–10.3)
Chloride: 104 mmol/L (ref 98–110)
Creat: 0.93 mg/dL (ref 0.60–1.35)
GFR, Est African American: 111 mL/min/{1.73_m2} (ref >=60)
GFR, Est Non African American: 96 mL/min/{1.73_m2} (ref >=60)
Globulin: 2.5 g/dL (calc) (ref 1.9–3.7)
Glucose, Bld: 93 mg/dL (ref 65–139)
Potassium: 3.8 mmol/L (ref 3.5–5.3)
Sodium: 140 mmol/L (ref 135–146)
Total Bilirubin: 0.3 mg/dL (ref 0.2–1.2)
Total Protein: 6.8 g/dL (ref 6.1–8.1)

## 2018-11-25 LAB — TSH: TSH: 1.4 mIU/L (ref 0.40–4.50)

## 2018-11-25 LAB — LIPID PANEL
Cholesterol: 139 mg/dL (ref <200)
HDL: 30 mg/dL — ABNORMAL LOW (ref >=40)
LDL Cholesterol (Calc): 80 mg/dL (calc)
Non-HDL Cholesterol (Calc): 109 mg/dL (calc) (ref <130)
Total CHOL/HDL Ratio: 4.6 (calc) (ref <5.0)
Triglycerides: 193 mg/dL — ABNORMAL HIGH (ref <150)

## 2018-11-25 LAB — PSA: PSA: 0.3 ng/mL (ref <=4.0)

## 2018-11-25 LAB — HEMOGLOBIN A1C
Hgb A1c MFr Bld: 6 % of total Hgb — ABNORMAL HIGH (ref <5.7)
Mean Plasma Glucose: 126 (calc)
eAG (mmol/L): 7 (calc)

## 2018-11-28 ENCOUNTER — Encounter: Payer: Self-pay | Admitting: Family Medicine

## 2018-11-28 NOTE — Assessment & Plan Note (Signed)
Hyperlipidemia:Low fat diet discussed and encouraged.   Lipid Panel  Lab Results  Component Value Date   CHOL 139 11/24/2018   HDL 30 (L) 11/24/2018   LDLCALC 80 11/24/2018   LDLDIRECT 86 06/01/2014   TRIG 193 (H) 11/24/2018   CHOLHDL 4.6 11/24/2018   Needs to incrase exercise and reduce fat intake

## 2018-11-28 NOTE — Assessment & Plan Note (Signed)
Patient educated about the importance of limiting  Carbohydrate intake , the need to commit to daily physical activity for a minimum of 30 minutes , and to commit weight loss. The fact that changes in all these areas will reduce or eliminate all together the development of diabetes is stressed.  Updated lab needed at/ before next visit.   Diabetic Labs Latest Ref Rng & Units 11/24/2018 01/26/2018 03/07/2017 03/14/2016 03/31/2015  HbA1c <5.7 % of total Hgb 6.0(H) - 5.6 5.7(H) 5.9(H)  Chol <200 mg/dL 139 - 113 161 169  HDL > OR = 40 mg/dL 30(L) - 26(L) 24(L) 24(L)  Calc LDL mg/dL (calc) 80 - 64 85 97  Triglycerides <150 mg/dL 193(H) - 150(H) 259(H) 239(H)  Creatinine 0.60 - 1.35 mg/dL 0.93 1.07 0.90 0.95 0.91   BP/Weight 11/24/2018 05/26/2018 01/26/2018 01/18/2018 08/31/2017 05/25/2017 41/08/2438  Systolic BP 102 725 366 440 347 425 956  Diastolic BP 78 70 78 80 90 90 88  Wt. (Lbs) 230 220.04 224 218.08 223 221 218.25  BMI 33 31.57 30.38 29.58 32 31.71 31.32   No flowsheet data found.

## 2018-11-28 NOTE — Assessment & Plan Note (Signed)
  Patient re-educated about  the importance of commitment to a  minimum of 150 minutes of exercise per week as able.  The importance of healthy food choices with portion control discussed, as well as eating regularly and within a 12 hour window most days. The need to choose "clean , green" food 50 to 75% of the time is discussed, as well as to make water the primary drink and set a goal of 64 ounces water daily.    Weight /BMI 11/24/2018 05/26/2018 01/26/2018  WEIGHT 230 lb 220 lb 0.6 oz 224 lb  HEIGHT 5\' 10"  5\' 10"  6\' 0"   BMI 33 kg/m2 31.57 kg/m2 30.38 kg/m2

## 2018-11-28 NOTE — Assessment & Plan Note (Signed)
Controlled, no change in medication DASH diet and commitment to daily physical activity for a minimum of 30 minutes discussed and encouraged, as a part of hypertension management. The importance of attaining a healthy weight is also discussed.  BP/Weight 11/24/2018 05/26/2018 01/26/2018 01/18/2018 08/31/2017 05/25/2017 89/08/7340  Systolic BP 876 811 572 620 355 974 163  Diastolic BP 78 70 78 80 90 90 88  Wt. (Lbs) 230 220.04 224 218.08 223 221 218.25  BMI 33 31.57 30.38 29.58 32 31.71 31.32

## 2018-11-28 NOTE — Assessment & Plan Note (Signed)
Chronic , worse with weight gain, will work on weight lss and back exercise, muscle relaxant as needed

## 2018-11-28 NOTE — Progress Notes (Signed)
Evan Black     MRN: [id number]      DOB: [birth year]   HPI Evan Black is here for follow up and re-evaluation of chronic medical conditions, medication management and review of any available recent lab and radiology data.  Preventive health is updated, specifically  Cancer screening and Immunization.   Questions or concerns regarding consultations or procedures which Evan Black has had in Evan interim are  addressed. Evan Black denies any adverse reactions to current medications since Evan last visit.  There are no new concerns.  There are no specific complaints  C/o uncontrolled appetite, eats all hours of Evan night and weight fain ROS Denies recent fever or chills. Denies sinus pressure, nasal congestion, ear pain or sore throat. Denies chest congestion, productive cough or wheezing. Denies chest pains, palpitations and leg swelling Denies abdominal pain, nausea, vomiting,diarrhea or constipation.   Denies dysuria, frequency, hesitancy or incontinence. C/o back pain and spasm, not limiting movment Denies headaches, seizures, numbness, or tingling. Denies depression, anxiety or insomnia. Denies skin break down or rash.   PE  BP 126/78   Pulse 80   Temp 98.7 F (37.1 C) (Temporal)   Resp 15   Ht 5\' 10"  (1.778 m)   Wt 230 lb (104.3 kg)   BMI 33.00 kg/m   Patient alert and oriented and in no cardiopulmonary distress.  HEENT: No facial asymmetry, EOMI,   oropharynx pink and moist.  Neck supple no JVD, no mass.  Chest: Clear to auscultation bilaterally.  CVS: S1, S2 no murmurs, no S3.Regular rate.  ABD: Soft non tender.   Ext: No edema  MS: Adequate ROM spine, shoulders, hips and knees.  Skin: Intact, no ulcerations or rash noted.  Psych: Good eye contact, normal affect. Memory intact not anxious or depressed appearing.  CNS: CN 2-12 intact, power,  normal throughout.no focal deficits noted.   Assessment & Plan  Essential hypertension Controlled, no change in  medication DASH diet and commitment to daily physical activity for a minimum of 30 minutes discussed and encouraged, as a part of hypertension management. Evan importance of attaining a healthy weight is also discussed.  BP/Weight 11/24/2018 05/26/2018 01/26/2018 01/18/2018 08/31/2017 05/25/2017 40/02/8118  Systolic BP 147 829 562 130 865 784 696  Diastolic BP 78 70 78 80 90 90 88  Wt. (Lbs) 230 220.04 224 218.08 223 221 218.25  BMI 33 31.57 30.38 29.58 32 31.71 31.32       Back pain Chronic , worse with weight gain, will work on weight lss and back exercise, muscle relaxant as needed  Obesity (BMI 30.0-34.9)  Patient re-educated about  Evan importance of commitment to a  minimum of 150 minutes of exercise per week as able.  Evan importance of healthy food choices with portion control discussed, as well as eating regularly and within a 12 hour window most days. Evan need to choose "clean , green" food 50 to 75% of Evan time is discussed, as well as to make water Evan primary drink and set a goal of 64 ounces water daily.    Weight /BMI 11/24/2018 05/26/2018 01/26/2018  WEIGHT 230 lb 220 lb 0.6 oz 224 lb  HEIGHT 5\' 10"  5\' 10"  6\' 0"   BMI 33 kg/m2 31.57 kg/m2 30.38 kg/m2      Hyperlipidemia with target LDL less than 100 Hyperlipidemia:Low fat diet discussed and encouraged.   Lipid Panel  Lab Results  Component Value Date   CHOL 139 11/24/2018   HDL 30 (L)  11/24/2018   LDLCALC 80 11/24/2018   LDLDIRECT 86 06/01/2014   TRIG 193 (H) 11/24/2018   CHOLHDL 4.6 11/24/2018   Needs to incrase exercise and reduce fat intake    Prediabetes Patient educated about Evan importance of limiting  Carbohydrate intake , Evan need to commit to daily physical activity for a minimum of 30 minutes , and to commit weight loss. Evan fact that changes in all these areas will reduce or eliminate all together Evan development of diabetes is stressed.  Updated lab needed at/ before next visit.   Diabetic  Labs Latest Ref Rng & Units 11/24/2018 01/26/2018 03/07/2017 03/14/2016 03/31/2015  HbA1c <5.7 % of total Hgb 6.0(H) - 5.6 5.7(H) 5.9(H)  Chol <200 mg/dL 139 - 113 161 169  HDL > OR = 40 mg/dL 30(L) - 26(L) 24(L) 24(L)  Calc LDL mg/dL (calc) 80 - 64 85 97  Triglycerides <150 mg/dL 193(H) - 150(H) 259(H) 239(H)  Creatinine 0.60 - 1.35 mg/dL 0.93 1.07 0.90 0.95 0.91   BP/Weight 11/24/2018 05/26/2018 01/26/2018 01/18/2018 08/31/2017 05/25/2017 17/12/9388  Systolic BP 300 923 300 762 263 335 456  Diastolic BP 78 70 78 80 90 90 88  Wt. (Lbs) 230 220.04 224 218.08 223 221 218.25  BMI 33 31.57 30.38 29.58 32 31.71 31.32   No flowsheet data found.

## 2019-02-21 ENCOUNTER — Other Ambulatory Visit: Payer: Self-pay | Admitting: Family Medicine

## 2019-03-30 ENCOUNTER — Other Ambulatory Visit: Payer: Self-pay

## 2019-03-30 ENCOUNTER — Ambulatory Visit: Payer: BC Managed Care – PPO | Admitting: Family Medicine

## 2019-03-30 ENCOUNTER — Encounter: Payer: Self-pay | Admitting: Family Medicine

## 2019-03-30 ENCOUNTER — Ambulatory Visit (INDEPENDENT_AMBULATORY_CARE_PROVIDER_SITE_OTHER): Payer: BC Managed Care – PPO | Admitting: Family Medicine

## 2019-03-30 VITALS — BP 124/78 | HR 80 | Resp 15 | Ht 70.0 in | Wt 222.0 lb

## 2019-03-30 DIAGNOSIS — R7303 Prediabetes: Secondary | ICD-10-CM

## 2019-03-30 DIAGNOSIS — E669 Obesity, unspecified: Secondary | ICD-10-CM

## 2019-03-30 DIAGNOSIS — I10 Essential (primary) hypertension: Secondary | ICD-10-CM

## 2019-03-30 DIAGNOSIS — E66811 Obesity, class 1: Secondary | ICD-10-CM

## 2019-03-30 DIAGNOSIS — E785 Hyperlipidemia, unspecified: Secondary | ICD-10-CM | POA: Diagnosis not present

## 2019-03-30 DIAGNOSIS — Z1211 Encounter for screening for malignant neoplasm of colon: Secondary | ICD-10-CM | POA: Diagnosis not present

## 2019-03-30 DIAGNOSIS — Z23 Encounter for immunization: Secondary | ICD-10-CM

## 2019-03-30 MED ORDER — PHENTERMINE HCL 37.5 MG PO TABS: ORAL_TABLET | ORAL | 1 refills | Status: DC

## 2019-03-30 NOTE — Assessment & Plan Note (Signed)
Controlled, no change in medication DASH diet and commitment to daily physical activity for a minimum of 30 minutes discussed and encouraged, as a part of hypertension management. The importance of attaining a healthy weight is also discussed.  BP/Weight 03/30/2019 11/24/2018 05/26/2018 01/26/2018 01/18/2018 08/31/2017 0000000  Systolic BP [id number] [id number] 123456 123456 0000000 [id number]  Diastolic BP 78 78 70 78 80 90 90  Wt. (Lbs) 222 230 220.04 224 218.08 223 221  BMI 31.85 33 31.57 30.38 29.58 32 31.71

## 2019-03-30 NOTE — Patient Instructions (Addendum)
F/U in 4 months, call if you need me before   Weight loss goal of 2.5 to  3 pounds per month  Flu vaccine  Today  Continue half phentermine daily  Thanks for choosing [facility], we consider it a privelige to serve you.   HBA1c , cmp and EGFR, lipid fasting 1 week before follow up   You are referred for colonoscopy in 201[address]

## 2019-03-30 NOTE — Assessment & Plan Note (Signed)
Patient educated about the importance of limiting  Carbohydrate intake , the need to commit to daily physical activity for a minimum of 30 minutes , and to commit weight loss. The fact that changes in all these areas will reduce or eliminate all together the development of diabetes is stressed.   Diabetic Labs Latest Ref Rng & Units 11/24/2018 01/26/2018 03/07/2017 03/14/2016 03/31/2015  HbA1c <5.7 % of total Hgb 6.0(H) - 5.6 5.7(H) 5.9(H)  Chol <200 mg/dL 139 - 113 161 169  HDL > OR = 40 mg/dL 30(L) - 26(L) 24(L) 24(L)  Calc LDL mg/dL (calc) 80 - 64 85 97  Triglycerides <150 mg/dL 193(H) - 150(H) 259(H) 239(H)  Creatinine 0.60 - 1.35 mg/dL 0.93 1.07 0.90 0.95 0.91   BP/Weight 03/30/2019 11/24/2018 05/26/2018 01/26/2018 01/18/2018 08/31/2017 0000000  Systolic BP [id number] 123456 [id number] 0000000 [id number]  Diastolic BP 78 78 70 78 80 90 90  Wt. (Lbs) 222 230 220.04 224 218.08 223 221  BMI 31.85 33 31.57 30.38 29.58 32 31.71   No flowsheet data found.  Updated lab needed at/ before next visit.

## 2019-03-31 NOTE — Progress Notes (Signed)
Evan Black     MRN: [id number]      DOB: 1970/11/15   HPI Evan Black is here for follow up and re-evaluation of chronic medical conditions, medication management and review of any available recent lab and radiology data.  Preventive health is updated, specifically  Cancer screening and Immunization.   Questions or concerns regarding consultations or procedures which the PT has had in the interim are  addressed. The PT denies any adverse reactions to current medications since the last visit.  There are no new concerns.  There are no specific complaints   ROS Denies recent fever or chills. Denies sinus pressure, nasal congestion, ear pain or sore throat. Denies chest congestion, productive cough or wheezing. Denies chest pains, palpitations and leg swelling Denies abdominal pain, nausea, vomiting,diarrhea or constipation.   Denies dysuria, frequency, hesitancy or incontinence. Denies joint pain, swelling and limitation in mobility. Denies headaches, seizures, numbness, or tingling. Denies depression, anxiety or insomnia. Denies skin break down or rash.   PE  BP 124/78   Pulse 80   Resp 15   Ht 5\' 10"  (1.778 m)   Wt 222 lb (100.7 kg)   BMI 31.85 kg/m   Patient alert and oriented and in no cardiopulmonary distress.  HEENT: No facial asymmetry, EOMI,     Neck supple .  Chest: Clear to auscultation bilaterally.  CVS: S1, S2 no murmurs, no S3.Regular rate.  ABD: Soft non tender.   Ext: No edema  MS: Adequate ROM spine, shoulders, hips and knees.  Skin: Intact, no ulcerations or rash noted.  Psych: Good eye contact, normal affect. Memory intact not anxious or depressed appearing.  CNS: CN 2-12 intact, power,  normal throughout.no focal deficits noted.   Assessment & Plan  Essential hypertension Controlled, no change in medication DASH diet and commitment to daily physical activity for a minimum of 30 minutes discussed and encouraged, as a part of hypertension  management. The importance of attaining a healthy weight is also discussed.  BP/Weight 03/30/2019 11/24/2018 05/26/2018 01/26/2018 01/18/2018 08/31/2017 0000000  Systolic BP A999333 [id number] 123456 [id number] 0000000 [id number]  Diastolic BP 78 78 70 78 80 90 90  Wt. (Lbs) 222 230 220.04 224 218.08 223 221  BMI 31.85 33 31.57 30.38 29.58 32 31.71       Prediabetes Patient educated about the importance of limiting  Carbohydrate intake , the need to commit to daily physical activity for a minimum of 30 minutes , and to commit weight loss. The fact that changes in all these areas will reduce or eliminate all together the development of diabetes is stressed.   Diabetic Labs Latest Ref Rng & Units 11/24/2018 01/26/2018 03/07/2017 03/14/2016 03/31/2015  HbA1c <5.7 % of total Hgb 6.0(H) - 5.6 5.7(H) 5.9(H)  Chol <200 mg/dL 139 - 113 161 169  HDL > OR = 40 mg/dL 30(L) - 26(L) 24(L) 24(L)  Calc LDL mg/dL (calc) 80 - 64 85 97  Triglycerides <150 mg/dL 193(H) - 150(H) 259(H) 239(H)  Creatinine 0.60 - 1.35 mg/dL 0.93 1.07 0.90 0.95 0.91   BP/Weight 03/30/2019 11/24/2018 05/26/2018 01/26/2018 01/18/2018 08/31/2017 0000000  Systolic BP [id number] 123456 [id number] 0000000 [id number]  Diastolic BP 78 78 70 78 80 90 90  Wt. (Lbs) 222 230 220.04 224 218.08 223 221  BMI 31.85 33 31.57 30.38 29.58 32 31.71   No flowsheet data found.  Updated lab needed at/ before next visit.   Obesity (BMI 30.0-34.9)  Patient re-educated about  the importance of commitment to a  minimum of 150 minutes of exercise per week as able.  The importance of healthy food choices with portion control discussed, as well as eating regularly and within a 12 hour window most days. The need to choose "clean , green" food 50 to 75% of the time is discussed, as well as to make water the primary drink and set a goal of 64 ounces water daily.    Weight /BMI 03/30/2019 11/24/2018 05/26/2018  WEIGHT 222 lb 230 lb 220 lb 0.6 oz  HEIGHT 5\' 10"  5\' 10"  5\' 10"   BMI 31.85 kg/m2 33  kg/m2 31.57 kg/m2    Continue half phentermine daily  Hyperlipidemia with target LDL less than 100 Hyperlipidemia:Low fat diet discussed and encouraged.   Lipid Panel  Lab Results  Component Value Date   CHOL 139 11/24/2018   HDL 30 (L) 11/24/2018   LDLCALC 80 11/24/2018   LDLDIRECT 86 06/01/2014   TRIG 193 (H) 11/24/2018   CHOLHDL 4.6 11/24/2018   Not at goal , need to lower intake of fried and fatty foods

## 2019-03-31 NOTE — Assessment & Plan Note (Signed)
  Patient re-educated about  the importance of commitment to a  minimum of 150 minutes of exercise per week as able.  The importance of healthy food choices with portion control discussed, as well as eating regularly and within a 12 hour window most days. The need to choose "clean , green" food 50 to 75% of the time is discussed, as well as to make water the primary drink and set a goal of 64 ounces water daily.    Weight /BMI 03/30/2019 11/24/2018 05/26/2018  WEIGHT 222 lb 230 lb 220 lb 0.6 oz  HEIGHT 5\' 10"  5\' 10"  5\' 10"   BMI 31.85 kg/m2 33 kg/m2 31.57 kg/m2    Continue half phentermine daily

## 2019-03-31 NOTE — Assessment & Plan Note (Signed)
Hyperlipidemia:Low fat diet discussed and encouraged.   Lipid Panel  Lab Results  Component Value Date   CHOL 139 11/24/2018   HDL 30 (L) 11/24/2018   LDLCALC 80 11/24/2018   LDLDIRECT 86 06/01/2014   TRIG 193 (H) 11/24/2018   CHOLHDL 4.6 11/24/2018   Not at goal , need to lower intake of fried and fatty foods

## 2019-04-05 ENCOUNTER — Ambulatory Visit: Payer: BC Managed Care – PPO | Admitting: Family Medicine

## 2019-04-05 ENCOUNTER — Other Ambulatory Visit: Payer: Self-pay

## 2019-04-05 ENCOUNTER — Encounter: Payer: Self-pay | Admitting: Internal Medicine

## 2019-04-05 MED ORDER — AMLODIPINE BESYLATE 10 MG PO TABS: 10.0000 mg | ORAL_TABLET | Freq: Every day | ORAL | 1 refills | Status: DC

## 2019-04-05 MED ORDER — ATORVASTATIN CALCIUM 10 MG PO TABS: 10.0000 mg | ORAL_TABLET | Freq: Every day | ORAL | 1 refills | Status: DC

## 2019-04-05 MED ORDER — CYCLOBENZAPRINE HCL 10 MG PO TABS: ORAL_TABLET | ORAL | 1 refills | Status: DC

## 2019-04-05 MED ORDER — SPIRONOLACTONE 25 MG PO TABS: 25.0000 mg | ORAL_TABLET | Freq: Every day | ORAL | 1 refills | Status: DC

## 2019-05-10 ENCOUNTER — Encounter: Payer: Self-pay | Admitting: Internal Medicine

## 2019-05-10 ENCOUNTER — Ambulatory Visit: Payer: BC Managed Care – PPO

## 2019-05-10 ENCOUNTER — Telehealth: Payer: Self-pay | Admitting: *Deleted

## 2019-05-10 NOTE — Telephone Encounter (Signed)
PATIENT WAS A NO SHOW AND LETTER SENT  °

## 2019-05-10 NOTE — Telephone Encounter (Signed)
Noted  

## 2019-05-11 ENCOUNTER — Ambulatory Visit
Admission: EM | Admit: 2019-05-11 | Discharge: 2019-05-11 | Disposition: A | Discharge status: Home / Self Care | Payer: BC Managed Care – PPO | Attending: Emergency Medicine | Admitting: Emergency Medicine

## 2019-05-11 ENCOUNTER — Other Ambulatory Visit: Payer: Self-pay

## 2019-05-11 DIAGNOSIS — Z20828 Contact with and (suspected) exposure to other viral communicable diseases: Secondary | ICD-10-CM | POA: Diagnosis not present

## 2019-05-11 DIAGNOSIS — Z20822 Contact with and (suspected) exposure to covid-19: Secondary | ICD-10-CM

## 2019-05-11 NOTE — ED Provider Notes (Signed)
[place]   [id number] 05/11/19 Arrival Time: [id number]   CC: COVID exposure; COVID test  SUBJECTIVE: History from: patient.  Evan Black is a 48 y.o. male who presents for COVID testing.  Does admit to positive COVID exposure 6 days ago to brother-in-law.  Denies recent travel.  Denies aggravating or alleviating symptoms.  Denies previous COVID infection.   Denies fever, chills, fatigue, nasal congestion, rhinorrhea, sore throat, cough, SOB, wheezing, chest pain, nausea, vomiting, changes in bowel or bladder habits.    ROS: As per HPI.  All other pertinent ROS negative.     Past Medical History:  Diagnosis Date  . Hypertension    Past Surgical History:  Procedure Laterality Date  . BACK SURGERY     No Known Allergies No current facility-administered medications on file prior to encounter.    Current Outpatient Medications on File Prior to Encounter  Medication Sig Dispense Refill  . amLODipine (NORVASC) 10 MG tablet Take 1 tablet (10 mg total) by mouth daily. 90 tablet 1  . atorvastatin (LIPITOR) 10 MG tablet Take 1 tablet (10 mg total) by mouth daily. 90 tablet 1  . cyclobenzaprine (FLEXERIL) 10 MG tablet One tablet once daily as needed, for muscle spasm 90 tablet 1  . phentermine (ADIPEX-P) 37.5 MG tablet Take half tablet by mouth every morning with breakfast 30 tablet 1  . phentermine (ADIPEX-P) 37.5 MG tablet Take one half tablet every morning with breakfast, by mouth 30 tablet 1  . spironolactone (ALDACTONE) 25 MG tablet Take 1 tablet (25 mg total) by mouth daily. 90 tablet 1   Social History   Socioeconomic History  . Marital status: Married    Spouse name: Not on file  . Number of children: Not on file  . Years of education: Not on file  . Highest education level: Not on file  Occupational History  . Not on file  Social Needs  . Financial resource strain: Not on file  . Food insecurity    Worry: Not on file    Inability: Not on file  .  Transportation needs    Medical: Not on file    Non-medical: Not on file  Tobacco Use  . Smoking status: Light Tobacco Smoker    Packs/day: 0.10    Types: Cigarettes  . Smokeless tobacco: Former Systems developer    Quit date: 12/24/2012  Substance and Sexual Activity  . Alcohol use: Yes    Alcohol/week: 18.0 standard drinks    Types: 18 Cans of beer per week  . Drug use: No  . Sexual activity: Yes  Lifestyle  . Physical activity    Days per week: Not on file    Minutes per session: Not on file  . Stress: Not on file  Relationships  . Social Herbalist on phone: Not on file    Gets together: Not on file    Attends religious service: Not on file    Active member of club or organization: Not on file    Attends meetings of clubs or organizations: Not on file    Relationship status: Not on file  . Intimate partner violence    Fear of current or ex partner: Not on file    Emotionally abused: Not on file    Physically abused: Not on file    Forced sexual activity: Not on file  Other Topics Concern  . Not on file  Social History Narrative  . Not on file   History  reviewed. No pertinent family history.  OBJECTIVE:  Vitals:   05/11/19 1330  BP: (!) 156/93  Pulse: 75  Resp: 16  Temp: 98.3 F (36.8 C)  TempSrc: Oral  SpO2: 93%     General appearance: alert; well-appearing, nontoxic; speaking in full sentences and tolerating own secretions HEENT: NCAT; Ears: EACs clear, TMs pearly gray; Eyes: PERRL.  EOM grossly intact. Nose: nares patent without rhinorrhea, Throat: oropharynx clear, tonsils non erythematous or enlarged, uvula midline  Neck: supple without LAD Lungs: unlabored respirations, symmetrical air entry; cough: absent; no respiratory distress; CTAB Heart: regular rate and rhythm.  Skin: warm and dry Psychological: alert and cooperative; normal mood and affect  ASSESSMENT & PLAN:  1. Close exposure to COVID-19 virus   2. Encounter for laboratory testing for  COVID-19 virus    COVID testing ordered.  It will take between 5-7 days for test results.  Someone will contact you regarding abnormal results.    In the meantime: You should remain isolated in your home for 10 days from symptom onset AND greater than 72 hours after symptoms resolution (absence of fever without the use of fever-reducing medication and improvement in respiratory symptoms), whichever is longer Get plenty of rest and push fluids Use OTC zyrtec as needed for nasal congestion, runny nose, and/or sore throat Use OTC flonase as needed for nasal congestion and runny nose Use OTC medications like ibuprofen or tylenol as needed fever or pain Call or go to the ED if you have any new or worsening symptoms such as fever, worsening cough, shortness of breath, chest tightness, chest pain, turning blue, changes in mental status, etc...   Reviewed expectations re: course of current medical issues. Questions answered. Outlined signs and symptoms indicating need for more acute intervention. Patient verbalized understanding. After Visit Summary given.         Lestine Box, PA-C 05/11/19 1358

## 2019-05-11 NOTE — Discharge Instructions (Addendum)
COVID testing ordered.  It will take between 5-7 days for test results.  Someone will contact you regarding abnormal results.    In the meantime: You should remain isolated in your home for 10 days from symptom onset AND greater than 72 hours after symptoms resolution (absence of fever without the use of fever-reducing medication and improvement in respiratory symptoms), whichever is longer OR 14 days from exposure Get plenty of rest and push fluids Use OTC zyrtec as needed for nasal congestion, runny nose, and/or sore throat Use OTC flonase as needed for nasal congestion and runny nose Use OTC medications like ibuprofen or tylenol as needed fever or pain Call or go to the ED if you have any new or worsening symptoms such as fever, worsening cough, shortness of breath, chest tightness, chest pain, turning blue, changes in mental status, etc..Marland Kitchen

## 2019-05-11 NOTE — ED Triage Notes (Signed)
Pt here for covid test after positive exposure on Thursday last week, pt doesn't have symptoms

## 2019-05-13 LAB — NOVEL CORONAVIRUS, NAA: SARS-CoV-2, NAA: NOT DETECTED

## 2019-07-26 ENCOUNTER — Other Ambulatory Visit: Payer: Self-pay

## 2019-07-26 ENCOUNTER — Ambulatory Visit
Admission: EM | Admit: 2019-07-26 | Discharge: 2019-07-26 | Disposition: A | Discharge status: Home / Self Care | Payer: BC Managed Care – PPO | Attending: Emergency Medicine | Admitting: Emergency Medicine

## 2019-07-26 DIAGNOSIS — H01001 Unspecified blepharitis right upper eyelid: Secondary | ICD-10-CM

## 2019-07-26 DIAGNOSIS — H01004 Unspecified blepharitis left upper eyelid: Secondary | ICD-10-CM | POA: Diagnosis not present

## 2019-07-26 DIAGNOSIS — H02849 Edema of unspecified eye, unspecified eyelid: Secondary | ICD-10-CM

## 2019-07-26 MED ORDER — ERYTHROMYCIN 5 MG/GM OP OINT: TOPICAL_OINTMENT | OPHTHALMIC | 0 refills | Status: DC

## 2019-07-26 MED ORDER — DEXAMETHASONE SODIUM PHOSPHATE 10 MG/ML IJ SOLN
10.0000 mg | Freq: Once | INTRAMUSCULAR | Status: AC
  Administered 2019-07-26: 10 mg via INTRAMUSCULAR

## 2019-07-26 NOTE — Discharge Instructions (Signed)
Steroid shot given office Continue warm compresses at home.  Soak a wash cloth in warm (not scalding) water and place it over the eyes. As the wash cloth cools, it should be rewarmed and replaced for a total of 5 to 10 minutes of soaking time. Warm compresses should be applied two to four times a day as long as the patient has symptoms Perform lid washing: Either warm water or very dilute baby shampoo can be placed on a clean wash cloth, gauze pad, or cotton swab. Then be advised to gently clean along the lashes and lid margin to remove the accumulated material with care to avoid contacting the ocular surface. If shampoo is used, thorough rinsing is recommended. Vigorous washing should be avoided, as it may cause more irritation.  Prescribed erythromycin ointment.  Apply up to 6 times daily for 5-7 days, or until symptomatic improvement Follow up with ophthalmology for further evaluation and management if symptoms persists Return or go to ER if you have any new or worsening symptoms such as fever, chills, redness, swelling, eye pain, painful eye movements, vision changes, etc..Marland Kitchen

## 2019-07-26 NOTE — ED Provider Notes (Signed)
[place]   [id number] 07/26/19 Arrival Time: 1132  CC: Eyelid redness and swelling  SUBJECTIVE:  Evan Black is a 49 y.o. male who presents with complaint of bilateral upper eyelid swelling, redness, and itching x 5 days.  Does admit to plucking eyelashes to prevent them from going into his eyes.  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  However, denies FB sensation in to eyes.  Has tried OTC eye drops with relief.  Symptoms are made worse in the morning.  Denies similar symptoms in the past.  Denies fever, chills, nausea, vomiting, eye pain, painful eye movements, discharge, vision changes, double vision, FB sensation, periorbital erythema.     ROS: As per HPI.  All other pertinent ROS negative.     Past Medical History:  Diagnosis Date  . Hypertension    Past Surgical History:  Procedure Laterality Date  . BACK SURGERY     No Known Allergies No current facility-administered medications on file prior to encounter.   Current Outpatient Medications on File Prior to Encounter  Medication Sig Dispense Refill  . amLODipine (NORVASC) 10 MG tablet Take 1 tablet (10 mg total) by mouth daily. 90 tablet 1  . atorvastatin (LIPITOR) 10 MG tablet Take 1 tablet (10 mg total) by mouth daily. 90 tablet 1  . cyclobenzaprine (FLEXERIL) 10 MG tablet One tablet once daily as needed, for muscle spasm 90 tablet 1  . [DISCONTINUED] phentermine (ADIPEX-P) 37.5 MG tablet Take one half tablet every morning with breakfast, by mouth 30 tablet 1  . [DISCONTINUED] spironolactone (ALDACTONE) 25 MG tablet Take 1 tablet (25 mg total) by mouth daily. 90 tablet 1   Social History   Socioeconomic History  . Marital status: Married    Spouse name: Not on file  . Number of children: Not on file  . Years of education: Not on file  . Highest education level: Not on file  Occupational History  . Not on file  Tobacco Use  . Smoking status: Light Tobacco Smoker    Packs/day: 0.10     Types: Cigarettes  . Smokeless tobacco: Former Systems developer    Quit date: 12/24/2012  Substance and Sexual Activity  . Alcohol use: Yes    Alcohol/week: 18.0 standard drinks    Types: 18 Cans of beer per week  . Drug use: No  . Sexual activity: Yes  Other Topics Concern  . Not on file  Social History Narrative  . Not on file   Social Determinants of Health   Financial Resource Strain:   . Difficulty of Paying Living Expenses: Not on file  Food Insecurity:   . Worried About Charity fundraiser in the Last Year: Not on file  . Ran Out of Food in the Last Year: Not on file  Transportation Needs:   . Lack of Transportation (Medical): Not on file  . Lack of Transportation (Non-Medical): Not on file  Physical Activity:   . Days of Exercise per Week: Not on file  . Minutes of Exercise per Session: Not on file  Stress:   . Feeling of Stress : Not on file  Social Connections:   . Frequency of Communication with Friends and Family: Not on file  . Frequency of Social Gatherings with Friends and Family: Not on file  . Attends Religious Services: Not on file  . Active Member of Clubs or Organizations: Not on file  . Attends Archivist Meetings: Not on file  .  Marital Status: Not on file  Intimate Partner Violence:   . Fear of Current or Ex-Partner: Not on file  . Emotionally Abused: Not on file  . Physically Abused: Not on file  . Sexually Abused: Not on file   Family History  Family history unknown: Yes    OBJECTIVE:  Vitals:   07/26/19 1138  BP: (!) 145/88  Pulse: 83  Resp: 17  Temp: 98.2 F (36.8 C)  TempSrc: Oral  SpO2: 96%    General appearance: alert; no distress Eyes: Bilateral upper eyelids with swelling and mild overlying erythema, NTTP, no obvious stye formation; no conjunctival erythema. PERRL; EOMI without discomfort;  no obvious drainage Neck: supple Lungs: clear to auscultation bilaterally Heart: regular rate and rhythm Skin: warm and  dry Psychological: alert and cooperative; normal mood and affect   ASSESSMENT & PLAN:  1. 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 erythromycin ophthalmic ointment    Sig: Place a 1/2 inch ribbon of ointment onto the upper eyelid.    Dispense:  3.5 g    Refill:  0    Order Specific Question:   Supervising Provider    Answer:   Raylene Everts [id number]  . dexamethasone (DECADRON) injection 10 mg    Steroid shot given office Continue warm compresses at home.  Soak a wash cloth in warm (not scalding) water and place it over the eyes. As the wash cloth cools, it should be rewarmed and replaced for a total of 5 to 10 minutes of soaking time. Warm compresses should be applied two to four times a day as long as the patient has symptoms Perform lid washing: Either warm water or very dilute baby shampoo can be placed on a clean wash cloth, gauze pad, or cotton swab. Then be advised to gently clean along the lashes and lid margin to remove the accumulated material with care to avoid contacting the ocular surface. If shampoo is used, thorough rinsing is recommended. Vigorous washing should be avoided, as it may cause more irritation.  Prescribed erythromycin ointment.  Apply up to 6 times daily for 5-7 days, or until symptomatic improvement Follow up with ophthalmology for further evaluation and management if symptoms persists Return or go to ER if you have any new or worsening symptoms such as fever, chills, redness, swelling, eye pain, painful eye movements, vision changes, etc...  Reviewed expectations re: course of current medical issues. Questions answered. Outlined signs and symptoms indicating need for more acute intervention. Patient verbalized understanding. After Visit Summary given.   Lestine Box, PA-C 07/26/19 1203

## 2019-07-26 NOTE — ED Triage Notes (Signed)
Provider at bedside prior to this RN, see provider notes

## 2019-07-28 ENCOUNTER — Ambulatory Visit: Payer: BC Managed Care – PPO | Admitting: Family Medicine

## 2019-08-04 ENCOUNTER — Other Ambulatory Visit: Payer: Self-pay

## 2019-08-04 ENCOUNTER — Ambulatory Visit: Payer: BC Managed Care – PPO | Admitting: Family Medicine

## 2019-08-25 ENCOUNTER — Ambulatory Visit: Payer: BC Managed Care – PPO | Attending: Family

## 2019-08-25 DIAGNOSIS — Z23 Encounter for immunization: Secondary | ICD-10-CM

## 2019-08-25 NOTE — Progress Notes (Signed)
   Covid-19 Vaccination Clinic  Name:  Evan Black    MRN: [id number] DOB: Oct 19, 1970  08/25/2019  Evan Black was observed post Covid-19 immunization for 15 minutes without incident. He was provided with Vaccine Information Sheet and instruction to access the V-Safe system.   Evan Black was instructed to call 911 with any severe reactions post vaccine: Marland Kitchen Difficulty breathing  . Swelling of face and throat  . A fast heartbeat  . A bad rash all over body  . Dizziness and weakness   Immunizations Administered    Name Date Dose VIS Date Route   Moderna COVID-19 Vaccine 08/25/2019  2:12 PM 0.5 mL 05/10/2019 Intramuscular   Manufacturer: Moderna   Lot: [id number]   [place]:[id number]

## 2019-09-27 ENCOUNTER — Ambulatory Visit: Payer: BC Managed Care – PPO | Attending: Family

## 2019-09-27 DIAGNOSIS — Z23 Encounter for immunization: Secondary | ICD-10-CM

## 2019-09-27 NOTE — Progress Notes (Signed)
   Covid-19 Vaccination Clinic  Name:  Evan Black    MRN: [id number] DOB: 11-Jul-1970  09/27/2019  [name] was observed post Covid-19 immunization for 15 minutes without incident. He was provided with Vaccine Information Sheet and instruction to access the V-Safe system.   [name] was instructed to call 911 with any severe reactions post vaccine: Marland Kitchen Difficulty breathing  . Swelling of face and throat  . A fast heartbeat  . A bad rash all over body  . Dizziness and weakness   Immunizations Administered    Name Date Dose VIS Date Route   Moderna COVID-19 Vaccine 09/27/2019  2:14 PM 0.5 mL 05/2019 Intramuscular   Manufacturer: Moderna   Lot: [id number]   [id number]

## 2019-09-30 ENCOUNTER — Other Ambulatory Visit: Payer: Self-pay | Admitting: Family Medicine

## 2019-11-12 ENCOUNTER — Other Ambulatory Visit: Payer: Self-pay | Admitting: Family Medicine

## 2019-11-24 ENCOUNTER — Telehealth: Payer: Self-pay

## 2019-11-24 ENCOUNTER — Other Ambulatory Visit: Payer: Self-pay

## 2019-11-24 ENCOUNTER — Other Ambulatory Visit: Payer: Self-pay | Admitting: Family Medicine

## 2019-11-24 MED ORDER — SPIRONOLACTONE 25 MG PO TABS: 25.0000 mg | ORAL_TABLET | Freq: Every day | ORAL | 0 refills | Status: DC

## 2019-11-24 NOTE — Telephone Encounter (Signed)
Refilled until sept

## 2019-11-24 NOTE — Telephone Encounter (Signed)
Pt made an appt for sept , however he is out of the spirolactone

## 2019-11-29 ENCOUNTER — Telehealth: Payer: Self-pay

## 2019-11-29 ENCOUNTER — Other Ambulatory Visit: Payer: Self-pay

## 2019-11-29 DIAGNOSIS — E559 Vitamin D deficiency, unspecified: Secondary | ICD-10-CM

## 2019-11-29 DIAGNOSIS — I10 Essential (primary) hypertension: Secondary | ICD-10-CM

## 2019-11-29 DIAGNOSIS — E8881 Metabolic syndrome: Secondary | ICD-10-CM

## 2019-11-29 DIAGNOSIS — E669 Obesity, unspecified: Secondary | ICD-10-CM

## 2019-11-29 DIAGNOSIS — Z125 Encounter for screening for malignant neoplasm of prostate: Secondary | ICD-10-CM

## 2019-11-29 DIAGNOSIS — E782 Mixed hyperlipidemia: Secondary | ICD-10-CM

## 2019-11-29 NOTE — Telephone Encounter (Signed)
Called patient to schedule over due cpe. No answer. Left message asking patient to call back to schedule appt

## 2019-12-15 ENCOUNTER — Other Ambulatory Visit: Payer: Self-pay | Admitting: Family Medicine

## 2019-12-20 ENCOUNTER — Encounter: Payer: Self-pay | Admitting: Family Medicine

## 2019-12-20 ENCOUNTER — Other Ambulatory Visit: Payer: Self-pay

## 2019-12-20 ENCOUNTER — Ambulatory Visit (INDEPENDENT_AMBULATORY_CARE_PROVIDER_SITE_OTHER): Payer: BC Managed Care – PPO | Admitting: Family Medicine

## 2019-12-20 VITALS — BP 137/84 | HR 86 | Resp 16 | Ht 70.0 in | Wt 212.1 lb

## 2019-12-20 DIAGNOSIS — E669 Obesity, unspecified: Secondary | ICD-10-CM

## 2019-12-20 DIAGNOSIS — Z23 Encounter for immunization: Secondary | ICD-10-CM | POA: Diagnosis not present

## 2019-12-20 DIAGNOSIS — Z1159 Encounter for screening for other viral diseases: Secondary | ICD-10-CM

## 2019-12-20 DIAGNOSIS — E8881 Metabolic syndrome: Secondary | ICD-10-CM

## 2019-12-20 DIAGNOSIS — F17210 Nicotine dependence, cigarettes, uncomplicated: Secondary | ICD-10-CM | POA: Diagnosis not present

## 2019-12-20 DIAGNOSIS — G4730 Sleep apnea, unspecified: Secondary | ICD-10-CM

## 2019-12-20 DIAGNOSIS — E782 Mixed hyperlipidemia: Secondary | ICD-10-CM

## 2019-12-20 DIAGNOSIS — E785 Hyperlipidemia, unspecified: Secondary | ICD-10-CM

## 2019-12-20 DIAGNOSIS — Z125 Encounter for screening for malignant neoplasm of prostate: Secondary | ICD-10-CM

## 2019-12-20 DIAGNOSIS — E559 Vitamin D deficiency, unspecified: Secondary | ICD-10-CM

## 2019-12-20 DIAGNOSIS — I10 Essential (primary) hypertension: Secondary | ICD-10-CM | POA: Diagnosis not present

## 2019-12-20 DIAGNOSIS — R7303 Prediabetes: Secondary | ICD-10-CM

## 2019-12-20 DIAGNOSIS — Z1211 Encounter for screening for malignant neoplasm of colon: Secondary | ICD-10-CM

## 2019-12-20 DIAGNOSIS — R0683 Snoring: Secondary | ICD-10-CM | POA: Diagnosis not present

## 2019-12-20 NOTE — Patient Instructions (Addendum)
Annual physical exam in office with MD in 4 months call if you need me sooner.  Shingrix # 1 today  Nurse visit for shingrix # 2 in 2 months  Congratulations on stopping  drinking none since 05/2019, and your 20 pound weight loss in the past 1 year.  Now your smoking 3 cigarettes a day on average cut back to 2 then 1 then plan to quit by the end of this month.  You can do this.  You are referred for a  screening colonoscopy important that you follow through and get this done.  You are referred referred for a sleep study important that you follow through and get this done  Please cut back on the number of hours that you work  Labs today , cBC, lipid, cmp anfd eGFR, tSH , pSA , vit d , hepatitis c screen and hBA1c

## 2019-12-21 ENCOUNTER — Encounter (INDEPENDENT_AMBULATORY_CARE_PROVIDER_SITE_OTHER): Payer: Self-pay | Admitting: *Deleted

## 2019-12-22 LAB — CMP14+EGFR
ALT: 28 IU/L (ref 0–44)
AST: 24 IU/L (ref 0–40)
Albumin/Globulin Ratio: 2 (ref 1.2–2.2)
Albumin: 4.9 g/dL (ref 4.0–5.0)
Alkaline Phosphatase: 69 IU/L (ref 48–121)
BUN/Creatinine Ratio: 9 (ref 9–20)
BUN: 9 mg/dL (ref 6–24)
Bilirubin Total: 0.4 mg/dL (ref 0.0–1.2)
CO2: 29 mmol/L (ref 20–29)
Calcium: 10.1 mg/dL (ref 8.7–10.2)
Chloride: 102 mmol/L (ref 96–106)
Creatinine, Ser: 1.05 mg/dL (ref 0.76–1.27)
GFR calc Af Amer: 95 mL/min/{1.73_m2} (ref >59)
GFR calc non Af Amer: 82 mL/min/{1.73_m2} (ref >59)
Globulin, Total: 2.4 g/dL (ref 1.5–4.5)
Glucose: 76 mg/dL (ref 65–99)
Potassium: 3.9 mmol/L (ref 3.5–5.2)
Sodium: 143 mmol/L (ref 134–144)
Total Protein: 7.3 g/dL (ref 6.0–8.5)

## 2019-12-22 LAB — CBC
Hematocrit: 45.2 % (ref 37.5–51.0)
Hemoglobin: 14.9 g/dL (ref 13.0–17.7)
MCH: 29.2 pg (ref 26.6–33.0)
MCHC: 33 g/dL (ref 31.5–35.7)
MCV: 89 fL (ref 79–97)
Platelets: 350 10*3/uL (ref 150–450)
RBC: 5.1 x10E6/uL (ref 4.14–5.80)
RDW: 13.8 % (ref 11.6–15.4)
WBC: 12.1 10*3/uL — ABNORMAL HIGH (ref 3.4–10.8)

## 2019-12-22 LAB — PSA: Prostate Specific Ag, Serum: 0.4 ng/mL (ref 0.0–4.0)

## 2019-12-22 LAB — LIPID PANEL
Chol/HDL Ratio: 4.9 ratio (ref 0.0–5.0)
Cholesterol, Total: 171 mg/dL (ref 100–199)
HDL: 35 mg/dL — ABNORMAL LOW (ref >39)
LDL Chol Calc (NIH): 104 mg/dL — ABNORMAL HIGH (ref 0–99)
Triglycerides: 184 mg/dL — ABNORMAL HIGH (ref 0–149)
VLDL Cholesterol Cal: 32 mg/dL (ref 5–40)

## 2019-12-22 LAB — HEMOGLOBIN A1C
Est. average glucose Bld gHb Est-mCnc: 128 mg/dL
Hgb A1c MFr Bld: 6.1 % — ABNORMAL HIGH (ref 4.8–5.6)

## 2019-12-22 LAB — VITAMIN D 25 HYDROXY (VIT D DEFICIENCY, FRACTURES): Vit D, 25-Hydroxy: 16.6 ng/mL — ABNORMAL LOW (ref 30.0–100.0)

## 2019-12-22 LAB — TSH: TSH: 1.23 u[IU]/mL (ref 0.450–4.500)

## 2019-12-22 LAB — HEPATITIS C ANTIBODY: Hep C Virus Ab: <0.1 {s_co_ratio} (ref 0.0–0.9)

## 2019-12-23 ENCOUNTER — Encounter: Payer: Self-pay | Admitting: Family Medicine

## 2019-12-23 DIAGNOSIS — F17210 Nicotine dependence, cigarettes, uncomplicated: Secondary | ICD-10-CM | POA: Insufficient documentation

## 2019-12-23 DIAGNOSIS — Z23 Encounter for immunization: Secondary | ICD-10-CM | POA: Insufficient documentation

## 2019-12-23 NOTE — Assessment & Plan Note (Signed)
Controlled, no change in medication DASH diet and commitment to daily physical activity for a minimum of 30 minutes discussed and encouraged, as a part of hypertension management. The importance of attaining a healthy weight is also discussed.  BP/Weight 12/20/2019 07/26/2019 05/11/2019 03/30/2019 11/24/2018 05/26/2018 4/71/5953  Systolic BP 967 289 791 504 136 438 377  Diastolic BP 84 88 93 78 78 70 78  Wt. (Lbs) 212.12 - - 222 230 220.04 224  BMI 30.44 - - 31.85 33 31.57 30.38

## 2019-12-23 NOTE — Assessment & Plan Note (Signed)
Asked:confirms currently smokes cigarettes 2 to 3/ day Assess: willing to quit and cutting back Advise: needs to QUIT to reduce risk of cancer, cardio and cerebrovascular disease Assist: counseled for 5 minutes and literature provided Arrange: follow up in 3 months

## 2019-12-23 NOTE — Assessment & Plan Note (Signed)
  Patient re-educated about  the importance of commitment to a  minimum of 150 minutes of exercise per week as able.  The importance of healthy food choices with portion control discussed, as well as eating regularly and within a 12 hour window most days. The need to choose "clean , green" food 50 to 75% of the time is discussed, as well as to make water the primary drink and set a goal of 64 ounces water daily.    Weight /BMI 12/20/2019 03/30/2019 11/24/2018  WEIGHT 212 lb 1.9 oz 222 lb 230 lb  HEIGHT 5\' 10"  5\' 10"  5\' 10"   BMI 30.44 kg/m2 31.85 kg/m2 33 kg/m2

## 2019-12-23 NOTE — Assessment & Plan Note (Signed)
Patient educated about the importance of limiting  Carbohydrate intake , the need to commit to daily physical activity for a minimum of 30 minutes , and to commit weight loss. The fact that changes in all these areas will reduce or eliminate all together the development of diabetes is stressed.  deteriorarted  Diabetic Labs Latest Ref Rng & Units 12/20/2019 11/24/2018 01/26/2018 03/07/2017 03/14/2016  HbA1c 4.8 - 5.6 % 6.1(H) 6.0(H) - 5.6 5.7(H)  Chol 100 - 199 mg/dL 171 139 - 113 161  HDL >39 mg/dL 35(L) 30(L) - 26(L) 24(L)  Calc LDL 0 - 99 mg/dL 104(H) 80 - 64 85  Triglycerides 0 - 149 mg/dL 184(H) 193(H) - 150(H) 259(H)  Creatinine 0.76 - 1.27 mg/dL 1.05 0.93 1.07 0.90 0.95   BP/Weight 12/20/2019 07/26/2019 05/11/2019 03/30/2019 11/24/2018 05/26/2018 2/80/0349  Systolic BP 179 150 569 794 801 655 374  Diastolic BP 84 88 93 78 78 70 78  Wt. (Lbs) 212.12 - - 222 230 220.04 224  BMI 30.44 - - 31.85 33 31.57 30.38   No flowsheet data found.

## 2019-12-23 NOTE — Assessment & Plan Note (Signed)
Hyperlipidemia:Low fat diet discussed and encouraged.   Lipid Panel  Lab Results  Component Value Date   CHOL 171 12/20/2019   HDL 35 (L) 12/20/2019   LDLCALC 104 (H) 12/20/2019   LDLDIRECT 86 06/01/2014   TRIG 184 (H) 12/20/2019   CHOLHDL 4.9 12/20/2019  improved but still not at goal

## 2019-12-23 NOTE — Assessment & Plan Note (Signed)
Snoring with excess fatigue and daytime sleepiness, refer pulmonary eval

## 2019-12-23 NOTE — Progress Notes (Signed)
Evan Black     MRN: [id number]      DOB: 1971/03/27   HPI Evan Black is here for follow up and re-evaluation of chronic medical conditions, medication management and review of any available recent lab and radiology data.  Preventive health is updated, specifically  Cancer screening and Immunization.   Questions or concerns regarding consultations or procedures which the PT has had in the interim are  addressed. The PT denies any adverse reactions to current medications since the last visit.  C/o excess fatigue, however works on [employer] 12 hrs/ day 5 days / week  ROS Denies recent fever or chills. Denies sinus pressure, nasal congestion, ear pain or sore throat. Denies chest congestion, productive cough or wheezing. Denies chest pains, palpitations and leg swelling Denies abdominal pain, nausea, vomiting,diarrhea or constipation.   Denies dysuria, frequency, hesitancy or incontinence. Denies joint pain, swelling and limitation in mobility. Denies headaches, seizures, numbness, or tingling. Denies depression, anxiety or insomnia. Denies skin break down or rash.   PE  BP 137/84   Pulse 86   Resp 16   Ht 5\' 10"  (1.778 m)   Wt 212 lb 1.9 oz (96.2 kg)   SpO2 98%   BMI 30.44 kg/m   Patient alert and oriented and in no cardiopulmonary distress.  HEENT: No facial asymmetry, EOMI,     Neck supple .  Chest: Clear to auscultation bilaterally.  CVS: S1, S2 no murmurs, no S3.Regular rate.  ABD: Soft non tender.   Ext: No edema  MS: Adequate ROM spine, shoulders, hips and knees.  Skin: Intact, no ulcerations or rash noted.  Psych: Good eye contact, normal affect. Memory intact not anxious or depressed appearing.  CNS: CN 2-12 intact, power,  normal throughout.no focal deficits noted.   Assessment & Plan  Essential hypertension Controlled, no change in medication DASH diet and commitment to daily physical activity for a minimum of 30 minutes discussed and encouraged,  as a part of hypertension management. The importance of attaining a healthy weight is also discussed.  BP/Weight 12/20/2019 07/26/2019 05/11/2019 03/30/2019 11/24/2018 05/26/2018 6/62/9476  Systolic BP 546 503 546 568 127 517 001  Diastolic BP 84 88 93 78 78 70 78  Wt. (Lbs) 212.12 - - 222 230 220.04 224  BMI 30.44 - - 31.85 33 31.57 30.38       Obesity (BMI 30.0-34.9)  Patient re-educated about  the importance of commitment to a  minimum of 150 minutes of exercise per week as able.  The importance of healthy food choices with portion control discussed, as well as eating regularly and within a 12 hour window most days. The need to choose "clean , green" food 50 to 75% of the time is discussed, as well as to make water the primary drink and set a goal of 64 ounces water daily.    Weight /BMI 12/20/2019 03/30/2019 11/24/2018  WEIGHT 212 lb 1.9 oz 222 lb 230 lb  HEIGHT 5\' 10"  5\' 10"  5\' 10"   BMI 30.44 kg/m2 31.85 kg/m2 33 kg/m2      Hyperlipidemia with target LDL less than 100 Hyperlipidemia:Low fat diet discussed and encouraged.   Lipid Panel  Lab Results  Component Value Date   CHOL 171 12/20/2019   HDL 35 (L) 12/20/2019   LDLCALC 104 (H) 12/20/2019   LDLDIRECT 86 06/01/2014   TRIG 184 (H) 12/20/2019   CHOLHDL 4.9 12/20/2019  improved but still not at goal     Prediabetes Patient educated about  the importance of limiting  Carbohydrate intake , the need to commit to daily physical activity for a minimum of 30 minutes , and to commit weight loss. The fact that changes in all these areas will reduce or eliminate all together the development of diabetes is stressed.  deteriorarted  Diabetic Labs Latest Ref Rng & Units 12/20/2019 11/24/2018 01/26/2018 03/07/2017 03/14/2016  HbA1c 4.8 - 5.6 % 6.1(H) 6.0(H) - 5.6 5.7(H)  Chol 100 - 199 mg/dL 171 139 - 113 161  HDL >39 mg/dL 35(L) 30(L) - 26(L) 24(L)  Calc LDL 0 - 99 mg/dL 104(H) 80 - 64 85  Triglycerides 0 - 149 mg/dL 184(H)  193(H) - 150(H) 259(H)  Creatinine 0.76 - 1.27 mg/dL 1.05 0.93 1.07 0.90 0.95   BP/Weight 12/20/2019 07/26/2019 05/11/2019 03/30/2019 11/24/2018 05/26/2018 07/06/1186  Systolic BP 677 373 668 159 470 761 518  Diastolic BP 84 88 93 78 78 70 78  Wt. (Lbs) 212.12 - - 222 230 220.04 224  BMI 30.44 - - 31.85 33 31.57 30.38   No flowsheet data found.    Sleep disorder breathing Snoring with excess fatigue and daytime sleepiness, refer pulmonary eval  Cigarette smoker motivated to quit Asked:confirms currently smokes cigarettes 2 to 3/ day Assess: willing to quit and cutting back Advise: needs to QUIT to reduce risk of cancer, cardio and cerebrovascular disease Assist: counseled for 5 minutes and literature provided Arrange: follow up in 3 months

## 2019-12-23 NOTE — Assessment & Plan Note (Signed)
After obtaining informed consent, the vaccine is  administered , with no adverse effect noted at the time of administration.  

## 2020-01-09 ENCOUNTER — Encounter: Payer: BC Managed Care – PPO | Admitting: Family Medicine

## 2020-01-17 ENCOUNTER — Other Ambulatory Visit: Payer: Self-pay | Admitting: Family Medicine

## 2020-01-24 ENCOUNTER — Encounter: Payer: Self-pay | Admitting: Family Medicine

## 2020-01-25 ENCOUNTER — Other Ambulatory Visit: Payer: Self-pay

## 2020-01-25 DIAGNOSIS — H01009 Unspecified blepharitis unspecified eye, unspecified eyelid: Secondary | ICD-10-CM

## 2020-02-01 ENCOUNTER — Other Ambulatory Visit: Payer: Self-pay | Admitting: Family Medicine

## 2020-02-20 ENCOUNTER — Ambulatory Visit: Payer: BC Managed Care – PPO

## 2020-02-28 ENCOUNTER — Other Ambulatory Visit: Payer: Self-pay

## 2020-02-28 ENCOUNTER — Encounter: Payer: Self-pay | Admitting: Family Medicine

## 2020-02-28 ENCOUNTER — Ambulatory Visit (INDEPENDENT_AMBULATORY_CARE_PROVIDER_SITE_OTHER): Payer: BC Managed Care – PPO | Admitting: Family Medicine

## 2020-02-28 VITALS — BP 130/80 | HR 80 | Resp 16 | Ht 70.0 in | Wt 212.0 lb

## 2020-02-28 DIAGNOSIS — E669 Obesity, unspecified: Secondary | ICD-10-CM

## 2020-02-28 DIAGNOSIS — E785 Hyperlipidemia, unspecified: Secondary | ICD-10-CM

## 2020-02-28 DIAGNOSIS — I10 Essential (primary) hypertension: Secondary | ICD-10-CM

## 2020-02-28 DIAGNOSIS — Z23 Encounter for immunization: Secondary | ICD-10-CM | POA: Diagnosis not present

## 2020-02-28 DIAGNOSIS — F17210 Nicotine dependence, cigarettes, uncomplicated: Secondary | ICD-10-CM | POA: Diagnosis not present

## 2020-02-28 DIAGNOSIS — M5440 Lumbago with sciatica, unspecified side: Secondary | ICD-10-CM

## 2020-02-28 MED ORDER — ERGOCALCIFEROL 1.25 MG (50000 UT) PO CAPS: 50000.0000 [IU] | ORAL_CAPSULE | ORAL | 1 refills | Status: DC

## 2020-02-28 NOTE — Patient Instructions (Addendum)
Keep annual physical exam appointment in November as before call if you need me sooner.  Blood pressure is excellent no change in medication.  New is once weekly vitamin D as this is low please collect and start taking as directed.  You do have an appointment regarding snoring on Monday the 27th here in [place] in the morning.  Letter will be written for your job asking that you be excused to keep this appointment.  We are printing the letter from Dr. Laural Golden regarding your colonoscopy.  You do need to fill out the form so that the colonoscopy can be scheduled.  This is important.  You are still currently smoking and advised that it is essential that you quit for improved health.  Please work consistently at this so that you do indeed stop smoking by the end of the year.  Tdap and flu vaccines today at this visit.  It is important that you exercise regularly at least 30 minutes 5 times a week. If you develop chest pain, have severe difficulty breathing, or feel very tired, stop exercising immediately and seek medical attention  'Think about what you will eat, plan ahead. Choose " clean, green, fresh or frozen" over canned, processed or packaged foods which are more sugary, salty and fatty. 70 to 75% of food eaten should be vegetables and fruit. Three meals at set times with snacks allowed between meals, but they must be fruit or vegetables. Aim to eat over a 12 hour period , example 7 am to 7 pm, and STOP after  your last meal of the day. Drink water,generally about 64 ounces per day, no other drink is as healthy. Fruit juice is best enjoyed in a healthy way, by EATING the fruit. Thanks for choosing [facility], we consider it a privelige to serve you.

## 2020-03-02 ENCOUNTER — Encounter: Payer: Self-pay | Admitting: Family Medicine

## 2020-03-02 ENCOUNTER — Other Ambulatory Visit: Payer: Self-pay | Admitting: Family Medicine

## 2020-03-02 MED ORDER — PREDNISONE 5 MG (21) PO TBPK
5.0000 mg | ORAL_TABLET | ORAL | 0 refills | Status: DC
Start: 2020-03-02 — End: 2020-04-24

## 2020-03-02 MED ORDER — IBUPROFEN 800 MG PO TABS
800.0000 mg | ORAL_TABLET | Freq: Three times a day (TID) | ORAL | 0 refills | Status: DC | PRN
Start: 2020-03-02 — End: 2020-06-26

## 2020-03-03 ENCOUNTER — Encounter: Payer: Self-pay | Admitting: Family Medicine

## 2020-03-03 NOTE — Assessment & Plan Note (Signed)
Asked:confirms currently smokes cigarettes °Assess: Unwilling to set a quit date, but is cutting back °Advise: needs to QUIT to reduce risk of cancer, cardio and cerebrovascular disease °Assist: counseled for 5 minutes and literature provided °Arrange: follow up in 2 to 4 months ° °

## 2020-03-03 NOTE — Assessment & Plan Note (Signed)
Hyperlipidemia:Low fat diet discussed and encouraged.   Lipid Panel  Lab Results  Component Value Date   CHOL 171 12/20/2019   HDL 35 (L) 12/20/2019   LDLCALC 104 (H) 12/20/2019   LDLDIRECT 86 06/01/2014   TRIG 184 (H) 12/20/2019   CHOLHDL 4.9 12/20/2019    needs to reduce fried and fatty foods

## 2020-03-03 NOTE — Assessment & Plan Note (Signed)
Post  Visit, report of current flare , anti inflammatories prescribed

## 2020-03-03 NOTE — Assessment & Plan Note (Signed)
Controlled, no change in medication DASH diet and commitment to daily physical activity for a minimum of 30 minutes discussed and encouraged, as a part of hypertension management. The importance of attaining a healthy weight is also discussed.  BP/Weight 02/28/2020 12/20/2019 07/26/2019 05/11/2019 03/30/2019 11/24/2018 42/55/2589  Systolic BP 483 475 830 746 002 984 730  Diastolic BP 80 84 88 93 78 78 70  Wt. (Lbs) 212 212.12 - - 222 230 220.04  BMI 30.42 30.44 - - 31.85 33 31.57

## 2020-03-03 NOTE — Progress Notes (Signed)
Evan Black     MRN: [id number]      DOB: September 09, 1970   HPI [name] is here for follow up and re-evaluation of chronic medical conditions, medication management and review of any available recent lab and radiology data.  Preventive health is updated, specifically  Cancer screening and Immunization.   Questions or concerns regarding consultations or procedures which the PT has had in the interim are  addressed. The PT denies any adverse reactions to current medications since the last visit.  C/o back and shoulder pain C/o snoring and fatigue, has upcoming appt with pulmonary ROS Denies recent fever or chills. Denies sinus pressure, nasal congestion, ear pain or sore throat. Denies chest congestion, productive cough or wheezing. Denies chest pains, palpitations and leg swelling Denies abdominal pain, nausea, vomiting,diarrhea or constipation.   Denies dysuria, frequency, hesitancy or incontinence. . Denies headaches, seizures, numbness, or tingling. Denies depression, anxiety or insomnia. Denies skin break down or rash.   PE  BP 130/80    Pulse 80    Resp 16    Wt 212 lb (96.2 kg)    SpO2 97%    BMI 30.42 kg/m   Patient alert and oriented and in no cardiopulmonary distress.  HEENT: No facial asymmetry, EOMI,     Neck supple .  Chest: Clear to auscultation bilaterally.  CVS: S1, S2 no murmurs, no S3.Regular rate.  ABD: Soft non tender.   Ext: No edema  MS: Adequate ROM spine, shoulders, hips and knees.  Skin: Intact, no ulcerations or rash noted.  Psych: Good eye contact, normal affect. Memory intact not anxious or depressed appearing.  CNS: CN 2-12 intact, power,  normal throughout.no focal deficits noted.   Assessment & Plan  Essential hypertension Controlled, no change in medication DASH diet and commitment to daily physical activity for a minimum of 30 minutes discussed and encouraged, as a part of hypertension management. The importance of attaining a  healthy weight is also discussed.  BP/Weight 02/28/2020 12/20/2019 07/26/2019 05/11/2019 03/30/2019 11/24/2018 04/88/8916  Systolic BP 945 038 882 800 349 179 150  Diastolic BP 80 84 88 93 78 78 70  Wt. (Lbs) 212 212.12 - - 222 230 220.04  BMI 30.42 30.44 - - 31.85 33 31.57       Obesity (BMI 30.0-34.9)  Patient re-educated about  the importance of commitment to a  minimum of 150 minutes of exercise per week as able.  The importance of healthy food choices with portion control discussed, as well as eating regularly and within a 12 hour window most days. The need to choose "clean , green" food 50 to 75% of the time is discussed, as well as to make water the primary drink and set a goal of 64 ounces water daily.    Weight /BMI 02/28/2020 12/20/2019 03/30/2019  WEIGHT 212 lb 212 lb 1.9 oz 222 lb  HEIGHT - 5\' 10"  5\' 10"   BMI 30.42 kg/m2 30.44 kg/m2 31.85 kg/m2      Hyperlipidemia with target LDL less than 100 Hyperlipidemia:Low fat diet discussed and encouraged.   Lipid Panel  Lab Results  Component Value Date   CHOL 171 12/20/2019   HDL 35 (L) 12/20/2019   LDLCALC 104 (H) 12/20/2019   LDLDIRECT 86 06/01/2014   TRIG 184 (H) 12/20/2019   CHOLHDL 4.9 12/20/2019    needs to reduce fried and fatty foods    Cigarette smoker motivated to quit Asked:confirms currently smokes cigarettes Assess: Unwilling to set a quit  date, but is cutting back Advise: needs to QUIT to reduce risk of cancer, cardio and cerebrovascular disease Assist: counseled for 5 minutes and literature provided Arrange: follow up in 2 to 4 months   Back pain Post  Visit, report of current flare , anti inflammatories prescribed

## 2020-03-03 NOTE — Assessment & Plan Note (Signed)
  Patient re-educated about  the importance of commitment to a  minimum of 150 minutes of exercise per week as able.  The importance of healthy food choices with portion control discussed, as well as eating regularly and within a 12 hour window most days. The need to choose "clean , green" food 50 to 75% of the time is discussed, as well as to make water the primary drink and set a goal of 64 ounces water daily.    Weight /BMI 02/28/2020 12/20/2019 03/30/2019  WEIGHT 212 lb 212 lb 1.9 oz 222 lb  HEIGHT - 5\' 10"  5\' 10"   BMI 30.42 kg/m2 30.44 kg/m2 31.85 kg/m2

## 2020-03-05 ENCOUNTER — Other Ambulatory Visit: Payer: Self-pay

## 2020-03-05 ENCOUNTER — Ambulatory Visit (INDEPENDENT_AMBULATORY_CARE_PROVIDER_SITE_OTHER): Payer: BC Managed Care – PPO | Admitting: Pulmonary Disease

## 2020-03-05 ENCOUNTER — Encounter: Payer: Self-pay | Admitting: Pulmonary Disease

## 2020-03-05 VITALS — BP 142/88 | HR 76 | Temp 97.6°F | Ht 70.0 in | Wt 212.0 lb

## 2020-03-05 DIAGNOSIS — G4730 Sleep apnea, unspecified: Secondary | ICD-10-CM

## 2020-03-05 NOTE — Patient Instructions (Signed)
Schedule home sleep test 

## 2020-03-05 NOTE — Assessment & Plan Note (Signed)
Given excessive daytime somnolence, narrow pharyngeal exam, witnessed apneas & loud snoring, obstructive sleep apnea is very likely & an overnight polysomnogram will be scheduled as a home sleep test. The pathophysiology of obstructive sleep apnea , it's cardiovascular consequences & modes of treatment including CPAP were discused with the patient in detail & they evidenced understanding.  Pretest probability is intermediate.  He would be amenable to use CPAP if needed

## 2020-03-05 NOTE — Progress Notes (Signed)
Subjective:    Patient ID: Evan Black, male    DOB: [birth year], 49 y.o.   MRN: [id number]  HPI  49 year old presents for evaluation of sleep disordered breathing.  He works in Psychologist, educational at [employer]. His wife has noted loud snoring and is complaining about his being excessively sleepy.  He attributes this to working hard, about 11 hours every day without days off over the last few months.  On weekends he will sometimes sleep straight up to 2 PM.  He also works part-time as a Art gallery manager. Epworth sleepiness score is 10 and he reports sleepiness while sitting and reading, as a passenger in a car or lying down to rest in the afternoons. Bedtime is between 9 and 10 PM but he is often falling asleep in his recliner before that time.  Sleep latency is minimal, he sleeps starting off on his side but rolls over on his back, 2 pillows below his head, reports 1-2 nocturnal awakenings including nocturia and is out of bed around 4:15 AM feeling tired with occasional dryness of mouth but denies headaches. His weight has not fluctuated over the last 2 years There is no history suggestive of cataplexy, sleep paralysis or parasomnias    Past Medical History:  Diagnosis Date  . Hypertension    Past Surgical History:  Procedure Laterality Date  . BACK SURGERY      No Known Allergies  Social History   Socioeconomic History  . Marital status: Married    Spouse name: Not on file  . Number of children: Not on file  . Years of education: Not on file  . Highest education level: Not on file  Occupational History  . Not on file  Tobacco Use  . Smoking status: Light Tobacco Smoker    Packs/day: 0.25    Years: 40.00    Pack years: 10.00    Types: Cigarettes  . Smokeless tobacco: Never Used  . Tobacco comment: smokes 2-3 cigarettes per day 03/05/2020  Substance and Sexual Activity  . Alcohol use: Yes    Alcohol/week: 18.0 standard drinks    Types: 18 Cans of beer per week  . Drug use: No  .  Sexual activity: Yes  Other Topics Concern  . Not on file  Social History Narrative  . Not on file   Social Determinants of Health   Financial Resource Strain:   . Difficulty of Paying Living Expenses: Not on file  Food Insecurity:   . Worried About Charity fundraiser in the Last Year: Not on file  . Ran Out of Food in the Last Year: Not on file  Transportation Needs:   . Lack of Transportation (Medical): Not on file  . Lack of Transportation (Non-Medical): Not on file  Physical Activity:   . Days of Exercise per Week: Not on file  . Minutes of Exercise per Session: Not on file  Stress:   . Feeling of Stress : Not on file  Social Connections:   . Frequency of Communication with Friends and Family: Not on file  . Frequency of Social Gatherings with Friends and Family: Not on file  . Attends Religious Services: Not on file  . Active Member of Clubs or Organizations: Not on file  . Attends Archivist Meetings: Not on file  . Marital Status: Not on file  Intimate Partner Violence:   . Fear of Current or Ex-Partner: Not on file  . Emotionally Abused: Not on file  . Physically Abused:  Not on file  . Sexually Abused: Not on file      Family History  Family history unknown: Yes    Review of Systems  + Acid heartburn  Constitutional: negative for anorexia, fevers and sweats  Eyes: negative for irritation, redness and visual disturbance  Ears, nose, mouth, throat, and face: negative for earaches, epistaxis, nasal congestion and sore throat  Respiratory: negative for cough, dyspnea on exertion, sputum and wheezing  Cardiovascular: negative for chest pain, dyspnea, lower extremity edema, orthopnea, palpitations and syncope  Gastrointestinal: negative for abdominal pain, constipation, diarrhea, melena, nausea and vomiting  Genitourinary:negative for dysuria, frequency and hematuria  Hematologic/lymphatic: negative for bleeding, easy bruising and lymphadenopathy   Musculoskeletal:negative for arthralgias, muscle weakness and stiff joints  Neurological: negative for coordination problems, gait problems, headaches and weakness  Endocrine: negative for diabetic symptoms including polydipsia, polyuria and weight loss     Objective:   Physical Exam  Gen. Pleasant, well-nourished, in no distress, normal affect ENT - no pallor,icterus, no post nasal drip., long uvula, bite ok Neck: No JVD, no thyromegaly, no carotid bruits Lungs: no use of accessory muscles, no dullness to percussion, clear without rales or rhonchi  Cardiovascular: Rhythm regular, heart sounds  normal, no murmurs or gallops, no peripheral edema Abdomen: soft and non-tender, no hepatosplenomegaly, BS normal. Musculoskeletal: No deformities, no cyanosis or clubbing Neuro:  alert, non focal       Assessment & Plan:

## 2020-03-23 ENCOUNTER — Other Ambulatory Visit: Payer: Self-pay | Admitting: Family Medicine

## 2020-04-16 ENCOUNTER — Other Ambulatory Visit: Payer: Self-pay | Admitting: Family Medicine

## 2020-04-24 ENCOUNTER — Encounter: Payer: Self-pay | Admitting: Family Medicine

## 2020-04-24 ENCOUNTER — Other Ambulatory Visit: Payer: Self-pay

## 2020-04-24 ENCOUNTER — Ambulatory Visit (INDEPENDENT_AMBULATORY_CARE_PROVIDER_SITE_OTHER): Payer: BC Managed Care – PPO | Admitting: Family Medicine

## 2020-04-24 ENCOUNTER — Other Ambulatory Visit: Payer: Self-pay | Admitting: Family Medicine

## 2020-04-24 ENCOUNTER — Telehealth: Payer: Self-pay

## 2020-04-24 VITALS — BP 148/90 | HR 76 | Resp 16 | Ht 70.0 in | Wt 211.1 lb

## 2020-04-24 DIAGNOSIS — R7303 Prediabetes: Secondary | ICD-10-CM | POA: Diagnosis not present

## 2020-04-24 DIAGNOSIS — Z0000 Encounter for general adult medical examination without abnormal findings: Secondary | ICD-10-CM

## 2020-04-24 DIAGNOSIS — E669 Obesity, unspecified: Secondary | ICD-10-CM

## 2020-04-24 DIAGNOSIS — E782 Mixed hyperlipidemia: Secondary | ICD-10-CM

## 2020-04-24 DIAGNOSIS — R9431 Abnormal electrocardiogram [ECG] [EKG]: Secondary | ICD-10-CM

## 2020-04-24 DIAGNOSIS — F17210 Nicotine dependence, cigarettes, uncomplicated: Secondary | ICD-10-CM

## 2020-04-24 DIAGNOSIS — I10 Essential (primary) hypertension: Secondary | ICD-10-CM | POA: Diagnosis not present

## 2020-04-24 DIAGNOSIS — Z125 Encounter for screening for malignant neoplasm of prostate: Secondary | ICD-10-CM

## 2020-04-24 DIAGNOSIS — E8881 Metabolic syndrome: Secondary | ICD-10-CM

## 2020-04-24 DIAGNOSIS — E785 Hyperlipidemia, unspecified: Secondary | ICD-10-CM

## 2020-04-24 LAB — POCT GLYCOSYLATED HEMOGLOBIN (HGB A1C): Hemoglobin A1C: 6.1 % — AB (ref 4.0–5.6)

## 2020-04-24 MED ORDER — SPIRONOLACTONE 50 MG PO TABS: 50.0000 mg | ORAL_TABLET | Freq: Every day | ORAL | 1 refills | Status: DC

## 2020-04-24 MED ORDER — CLOTRIMAZOLE-BETAMETHASONE 1-0.05 % EX CREA: 1.0000 "application " | TOPICAL_CREAM | Freq: Two times a day (BID) | CUTANEOUS | 1 refills | Status: DC

## 2020-04-24 NOTE — Telephone Encounter (Signed)
States he forgot to tell you that he was needing the cream sent in that you gave him before for his feet because they have been itching and peeling again but he can't remember the name of it

## 2020-04-24 NOTE — Telephone Encounter (Signed)
Med sent.

## 2020-04-24 NOTE — Patient Instructions (Addendum)
F/u IN OFFICE with mD , re evaluate blood pressure in mid January, call if you need me sooner  GlycohB and eKG in office today  Blood pressure high, new higher dose of spironolactone 50 mg one daily to start tomorrow, collect new  Medication today please  Increase vegetable and fruit and water   Please follow through on letter from Dr Olevia Perches office, complete questionnaire and get your colonoscopy. Printed letter provided at visit again  Now smoking 2 cigarettes/ day , I hope that by next week Monday you absolutely decide to [place]!  Labs today, PSA, chem 7 and eGFR   Fasting lipid, cmp and eGFR 3 days before January follow  Up    Coping with Quitting Smoking  Quitting smoking is a physical and mental challenge. You will face cravings, withdrawal symptoms, and temptation. Before quitting, work with your health care provider to make a plan that can help you cope. Preparation can help you quit and keep you from giving in. How can I cope with cravings? Cravings usually last for 5-10 minutes. If you get through it, the craving will pass. Consider taking the following actions to help you cope with cravings:  Keep your mouth busy: ? Chew sugar-free gum. ? Suck on hard candies or a straw. ? Brush your teeth.  Keep your hands and body busy: ? Immediately change to a different activity when you feel a craving. ? Squeeze or play with a ball. ? Do an activity or a hobby, like making bead jewelry, practicing needlepoint, or working with wood. ? Mix up your normal routine. ? Take a short exercise break. Go for a quick walk or run up and down stairs. ? Spend time in public places where smoking is not allowed.  Focus on doing something kind or helpful for someone else.  Call a friend or family member to talk during a craving.  Join a support group.  Call a quit line, such as 1-800-QUIT-NOW.  Talk with your health care provider about medicines that might help you cope with cravings and  make quitting easier for you. How can I deal with withdrawal symptoms? Your body may experience negative effects as it tries to get used to not having nicotine in the system. These effects are called withdrawal symptoms. They may include:  Feeling hungrier than normal.  Trouble concentrating.  Irritability.  Trouble sleeping.  Feeling depressed.  Restlessness and agitation.  Craving a cigarette. To manage withdrawal symptoms:  Avoid places, people, and activities that trigger your cravings.  Remember why you want to quit.  Get plenty of sleep.  Avoid coffee and other caffeinated drinks. These may worsen some of your symptoms. How can I handle social situations? Social situations can be difficult when you are quitting smoking, especially in the first few weeks. To manage this, you can:  Avoid parties, bars, and other social situations where people might be smoking.  Avoid alcohol.  Leave right away if you have the urge to smoke.  Explain to your family and friends that you are quitting smoking. Ask for understanding and support.  Plan activities with friends or family where smoking is not an option. What are some ways I can cope with stress? Wanting to smoke may cause stress, and stress can make you want to smoke. Find ways to manage your stress. Relaxation techniques can help. For example:  Breathe slowly and deeply, in through your nose and out through your mouth.  Listen to soothing, relaxing music.  Talk  with a family member or friend about your stress.  Light a candle.  Soak in a bath or take a shower.  Think about a peaceful place. What are some ways I can prevent weight gain? Be aware that many people gain weight after they quit smoking. However, not everyone does. To keep from gaining weight, have a plan in place before you quit and stick to the plan after you quit. Your plan should include:  Having healthy snacks. When you have a craving, it may help  to: ? Eat plain popcorn, crunchy carrots, celery, or other cut vegetables. ? Chew sugar-free gum.  Changing how you eat: ? Eat small portion sizes at meals. ? Eat 4-6 small meals throughout the day instead of 1-2 large meals a day. ? Be mindful when you eat. Do not watch television or do other things that might distract you as you eat.  Exercising regularly: ? Make time to exercise each day. If you do not have time for a long workout, do short bouts of exercise for 5-10 minutes several times a day. ? Do some form of strengthening exercise, like weight lifting, and some form of aerobic exercise, like running or swimming.  Drinking plenty of water or other low-calorie or no-calorie drinks. Drink 6-8 glasses of water daily, or as much as instructed by your health care provider. Summary  Quitting smoking is a physical and mental challenge. You will face cravings, withdrawal symptoms, and temptation to smoke again. Preparation can help you as you go through these challenges.  You can cope with cravings by keeping your mouth busy (such as by chewing gum), keeping your body and hands busy, and making calls to family, friends, or a helpline for people who want to quit smoking.  You can cope with withdrawal symptoms by avoiding places where people smoke, avoiding drinks with caffeine, and getting plenty of rest.  Ask your health care provider about the different ways to prevent weight gain, avoid stress, and handle social situations. This information is not intended to replace advice given to you by your health care provider. Make sure you discuss any questions you have with your health care provider. Document Revised: 05/08/2017 Document Reviewed: 05/23/2016 Elsevier Patient Education  2020 [employer].

## 2020-04-25 ENCOUNTER — Other Ambulatory Visit: Payer: Self-pay | Admitting: Family Medicine

## 2020-04-25 LAB — BMP8+EGFR
BUN/Creatinine Ratio: 12 (ref 9–20)
BUN: 11 mg/dL (ref 6–24)
CO2: 27 mmol/L (ref 20–29)
Calcium: 9.8 mg/dL (ref 8.7–10.2)
Chloride: 106 mmol/L (ref 96–106)
Creatinine, Ser: 0.91 mg/dL (ref 0.76–1.27)
GFR calc Af Amer: 113 mL/min/{1.73_m2} (ref >59)
GFR calc non Af Amer: 98 mL/min/{1.73_m2} (ref >59)
Glucose: 95 mg/dL (ref 65–99)
Potassium: 4.4 mmol/L (ref 3.5–5.2)
Sodium: 146 mmol/L — ABNORMAL HIGH (ref 134–144)

## 2020-04-25 LAB — PSA: Prostate Specific Ag, Serum: 0.3 ng/mL (ref 0.0–4.0)

## 2020-04-26 ENCOUNTER — Encounter: Payer: Self-pay | Admitting: Family Medicine

## 2020-04-26 NOTE — Assessment & Plan Note (Signed)
Patient educated about the importance of limiting  Carbohydrate intake , the need to commit to daily physical activity for a minimum of 30 minutes , and to commit weight loss. The fact that changes in all these areas will reduce or eliminate all together the development of diabetes is stressed.   Diabetic Labs Latest Ref Rng & Units 04/24/2020 12/20/2019 11/24/2018 01/26/2018 03/07/2017  HbA1c 4.0 - 5.6 % 6.1(A) 6.1(H) 6.0(H) - 5.6  Chol 100 - 199 mg/dL - 171 139 - 113  HDL >39 mg/dL - 35(L) 30(L) - 26(L)  Calc LDL 0 - 99 mg/dL - 104(H) 80 - 64  Triglycerides 0 - 149 mg/dL - 184(H) 193(H) - 150(H)  Creatinine 0.76 - 1.27 mg/dL 0.91 1.05 0.93 1.07 0.90   BP/Weight 04/24/2020 03/05/2020 02/28/2020 12/20/2019 07/26/2019 05/11/2019 80/08/4915  Systolic BP 915 056 979 480 165 537 482  Diastolic BP 90 88 80 84 88 93 78  Wt. (Lbs) 211.08 212 212 212.12 - - 222  BMI 30.29 30.42 30.42 30.44 - - 31.85   No flowsheet data found.

## 2020-04-26 NOTE — Assessment & Plan Note (Signed)
  Patient re-educated about  the importance of commitment to a  minimum of 150 minutes of exercise per week as able.  The importance of healthy food choices with portion control discussed, as well as eating regularly and within a 12 hour window most days. The need to choose "clean , green" food 50 to 75% of the time is discussed, as well as to make water the primary drink and set a goal of 64 ounces water daily.    Weight /BMI 04/24/2020 03/05/2020 02/28/2020  WEIGHT 211 lb 1.3 oz 212 lb 212 lb  HEIGHT 5\' 10"  5\' 10"  5\' 10"   BMI 30.29 kg/m2 30.42 kg/m2 30.42 kg/m2

## 2020-04-26 NOTE — Assessment & Plan Note (Signed)
The increased risk of cardiovascular disease associated with this diagnosis, and the need to consistently work on lifestyle to change this is discussed. Following  a  heart healthy diet ,commitment to 30 minutes of exercise at least 5 days per week, as well as control of blood sugar and cholesterol , and achieving a healthy weight are all the areas to be addressed .  

## 2020-04-26 NOTE — Assessment & Plan Note (Signed)
Uncontrolled , dose increase I medication DASH diet and commitment to daily physical activity for a minimum of 30 minutes discussed and encouraged, as a part of hypertension management. The importance of attaining a healthy weight is also discussed.  BP/Weight 04/24/2020 03/05/2020 02/28/2020 12/20/2019 07/26/2019 05/11/2019 24/81/8590  Systolic BP 931 121 624 469 507 225 750  Diastolic BP 90 88 80 84 88 93 78  Wt. (Lbs) 211.08 212 212 212.12 - - 222  BMI 30.29 30.42 30.42 30.44 - - 31.85   eKG : NSR, septal infarct , possible left atrial enlargement, multiplse CAD risk factors, refer to Cardiology

## 2020-04-26 NOTE — Assessment & Plan Note (Signed)
Asked:confirms currently smokes cigarettes 1 to 3/ day Assess: willing to set a quit date, and is cutting back Advise: needs to QUIT to reduce risk of cancer, cardio and cerebrovascular disease Assist: counseled for 5 minutes and literature provided Arrange: follow up in 2 to 4 months

## 2020-04-26 NOTE — Assessment & Plan Note (Signed)

## 2020-04-26 NOTE — Progress Notes (Signed)
Evan Black     MRN: [id number]      DOB: 06/01/1971   HPI: Patient is in for annual physical exam. Uncontrolled hypertension, prediabetes, nicotine and obesity are also addressed. Recent labs,are reviewed. Immunization is reviewed , and  Is up to date    PE; BP (!) 148/90   Pulse 76   Resp 16   Ht 5\' 10"  (1.778 m)   Wt 211 lb 1.3 oz (95.7 kg)   SpO2 97%   BMI 30.29 kg/m   Pleasant male, alert and oriented x 3, in no cardio-pulmonary distress. Afebrile. HEENT No facial trauma or asymetry. Sinuses non tender. EOMI External ears normal,  Neck: supple, no adenopathy,JVD or thyromegaly.No bruits.  Chest: Clear to ascultation bilaterally.No crackles or wheezes. Non tender to palpation  Cardiovascular system; Heart sounds normal,  S1 and  S2 ,no S3.  No murmur, or thrill. Apical beat not displaced Peripheral pulses normal.  Abdomen: Soft, non tender, No guarding, tenderness or rebound.    Musculoskeletal exam: Full ROM of spine, hips , shoulders and knees. No deformity ,swelling or crepitus noted. No muscle wasting or atrophy.   Neurologic: Cranial nerves 2 to 12 intact. Power, tone ,sensation and reflexes normal throughout. No disturbance in gait. No tremor.  Skin: Intact, no ulceration, erythema , scaling or rash noted. Pigmentation normal throughout  Psych; Normal mood and affect. Judgement and concentration normal   Assessment & Plan:  Annual physical exam Annual exam as documented. Counseling done  re healthy lifestyle involving commitment to 150 minutes exercise per week, heart healthy diet, and attaining healthy weight.The importance of adequate sleep also discussed. Regular seat belt use and home safety, is also discussed. Changes in health habits are decided on by the patient with goals and time frames  set for achieving them. Immunization and cancer screening needs are specifically addressed at this visit.   Prediabetes Patient educated  about the importance of limiting  Carbohydrate intake , the need to commit to daily physical activity for a minimum of 30 minutes , and to commit weight loss. The fact that changes in all these areas will reduce or eliminate all together the development of diabetes is stressed.   Diabetic Labs Latest Ref Rng & Units 04/24/2020 12/20/2019 11/24/2018 01/26/2018 03/07/2017  HbA1c 4.0 - 5.6 % 6.1(A) 6.1(H) 6.0(H) - 5.6  Chol 100 - 199 mg/dL - 171 139 - 113  HDL >39 mg/dL - 35(L) 30(L) - 26(L)  Calc LDL 0 - 99 mg/dL - 104(H) 80 - 64  Triglycerides 0 - 149 mg/dL - 184(H) 193(H) - 150(H)  Creatinine 0.76 - 1.27 mg/dL 0.91 1.05 0.93 1.07 0.90   BP/Weight 04/24/2020 03/05/2020 02/28/2020 12/20/2019 07/26/2019 05/11/2019 67/20/9470  Systolic BP 962 836 629 476 546 503 546  Diastolic BP 90 88 80 84 88 93 78  Wt. (Lbs) 211.08 212 212 212.12 - - 222  BMI 30.29 30.42 30.42 30.44 - - 31.85   No flowsheet data found.    Cigarette smoker motivated to quit Asked:confirms currently smokes cigarettes 1 to 3/ day Assess: willing to set a quit date, and is cutting back Advise: needs to QUIT to reduce risk of cancer, cardio and cerebrovascular disease Assist: counseled for 5 minutes and literature provided Arrange: follow up in 2 to 4 months   Essential hypertension Uncontrolled , dose increase I medication DASH diet and commitment to daily physical activity for a minimum of 30 minutes discussed and encouraged, as a part of hypertension  management. The importance of attaining a healthy weight is also discussed.  BP/Weight 04/24/2020 03/05/2020 02/28/2020 12/20/2019 07/26/2019 05/11/2019 73/40/3709  Systolic BP 643 838 184 037 543 606 770  Diastolic BP 90 88 80 84 88 93 78  Wt. (Lbs) 211.08 212 212 212.12 - - 222  BMI 30.29 30.42 30.42 30.44 - - 31.85   eKG : NSR, septal infarct , possible left atrial enlargement, multiplse CAD risk factors, refer to Cardiology   Obesity (BMI 30.0-34.9)  Patient re-educated  about  the importance of commitment to a  minimum of 150 minutes of exercise per week as able.  The importance of healthy food choices with portion control discussed, as well as eating regularly and within a 12 hour window most days. The need to choose "clean , green" food 50 to 75% of the time is discussed, as well as to make water the primary drink and set a goal of 64 ounces water daily.    Weight /BMI 04/24/2020 03/05/2020 02/28/2020  WEIGHT 211 lb 1.3 oz 212 lb 212 lb  HEIGHT 5\' 10"  5\' 10"  5\' 10"   BMI 30.29 kg/m2 30.42 kg/m2 30.42 kg/m2

## 2020-05-08 ENCOUNTER — Ambulatory Visit: Payer: BC Managed Care – PPO

## 2020-05-08 ENCOUNTER — Other Ambulatory Visit: Payer: Self-pay

## 2020-05-08 DIAGNOSIS — G4733 Obstructive sleep apnea (adult) (pediatric): Secondary | ICD-10-CM

## 2020-05-08 DIAGNOSIS — G4730 Sleep apnea, unspecified: Secondary | ICD-10-CM

## 2020-05-10 ENCOUNTER — Telehealth: Payer: Self-pay | Admitting: Pulmonary Disease

## 2020-05-10 DIAGNOSIS — G4733 Obstructive sleep apnea (adult) (pediatric): Secondary | ICD-10-CM

## 2020-05-10 NOTE — Telephone Encounter (Signed)
HST showed mod  OSA with AHI 28/ hr Suggest autoCPAP  5-15 cm, mask of choice OV with APP in 8-10 wks

## 2020-05-14 NOTE — Telephone Encounter (Signed)
Called and went over HST results per Dr Elsworth Soho with Jackelyn Poling (patient's wife, per [facility]). Debbie expressed full understanding of result and agreeable to Dr Bari Mantis recommendation to order CPAP. Order placed per Dr Elsworth Soho. Wife expressed understanding to call office to schedule office visit with NP 8-10 weeks once CPAP is set up. Nothing further needed at this time.

## 2020-05-22 NOTE — Telephone Encounter (Signed)
Called and spoke with Patient wife, Jackelyn Poling ([place]). Sleep study results reviewed with Debbie. Understanding stated.  Nothing further at this time.

## 2020-05-26 ENCOUNTER — Other Ambulatory Visit: Payer: Self-pay | Admitting: Family Medicine

## 2020-06-21 ENCOUNTER — Telehealth: Payer: Self-pay

## 2020-06-21 NOTE — Telephone Encounter (Signed)
Patient walked in and he needs a note saying he has appointment to see Evan Black on 1/18 to give his work so he can make this appoitment.

## 2020-06-21 NOTE — Telephone Encounter (Signed)
Per Cecille Rubin the appointment reminder was printed and gave to the patient

## 2020-06-26 ENCOUNTER — Other Ambulatory Visit: Payer: Self-pay | Admitting: Family Medicine

## 2020-06-26 ENCOUNTER — Ambulatory Visit: Payer: BC Managed Care – PPO | Admitting: Nurse Practitioner

## 2020-07-02 ENCOUNTER — Encounter: Payer: Self-pay | Admitting: Nurse Practitioner

## 2020-07-02 ENCOUNTER — Other Ambulatory Visit: Payer: Self-pay

## 2020-07-02 ENCOUNTER — Ambulatory Visit (INDEPENDENT_AMBULATORY_CARE_PROVIDER_SITE_OTHER): Payer: BC Managed Care – PPO | Admitting: Nurse Practitioner

## 2020-07-02 VITALS — BP 152/81 | HR 80 | Temp 98.2°F | Ht 70.0 in | Wt 219.0 lb

## 2020-07-02 DIAGNOSIS — F17210 Nicotine dependence, cigarettes, uncomplicated: Secondary | ICD-10-CM

## 2020-07-02 DIAGNOSIS — R7303 Prediabetes: Secondary | ICD-10-CM | POA: Diagnosis not present

## 2020-07-02 DIAGNOSIS — E559 Vitamin D deficiency, unspecified: Secondary | ICD-10-CM

## 2020-07-02 DIAGNOSIS — I10 Essential (primary) hypertension: Secondary | ICD-10-CM | POA: Diagnosis not present

## 2020-07-02 NOTE — Assessment & Plan Note (Signed)
-  he states he quit smoking for about a month, but he started smoking again today -we discussed using nicotine gum

## 2020-07-02 NOTE — Patient Instructions (Signed)
Your BP was fine today.  We will meet back up in 4-5 months for a lab follow-up.  Continue your current medications as prescribed.

## 2020-07-02 NOTE — Progress Notes (Signed)
Acute Office Visit  Subjective:    Patient ID: Evan Black, male    DOB: [birth year], 50 y.o.   MRN: [id number]  Chief Complaint  Patient presents with  . Follow-up    B/p     HPI Patient is in today for BP check.  He had a physical exam with Dr. Moshe Cipro on 04/24/20, and his BP at that time was 148/90.  At that time, she increased his spironolactone to 50 mg daily.  He is also taking amlodipine 10 mg daily.  He does not check his BP at home.  He states sometimes he gets nervous when he comes into the doctor's office.  Past Medical History:  Diagnosis Date  . Hypertension     Past Surgical History:  Procedure Laterality Date  . BACK SURGERY      Family History  Family history unknown: Yes    Social History   Socioeconomic History  . Marital status: Married    Spouse name: Not on file  . Number of children: Not on file  . Years of education: Not on file  . Highest education level: Not on file  Occupational History  . Not on file  Tobacco Use  . Smoking status: Light Tobacco Smoker    Packs/day: 0.25    Years: 40.00    Pack years: 10.00    Types: Cigarettes  . Smokeless tobacco: Never Used  . Tobacco comment: smokes 2-3 cigarettes per day 03/05/2020  Substance and Sexual Activity  . Alcohol use: Yes    Alcohol/week: 18.0 standard drinks    Types: 18 Cans of beer per week  . Drug use: No  . Sexual activity: Yes  Other Topics Concern  . Not on file  Social History Narrative  . Not on file   Social Determinants of Health   Financial Resource Strain: Not on file  Food Insecurity: Not on file  Transportation Needs: Not on file  Physical Activity: Not on file  Stress: Not on file  Social Connections: Not on file  Intimate Partner Violence: Not on file    Outpatient Medications Prior to Visit  Medication Sig Dispense Refill  . amLODipine (NORVASC) 10 MG tablet TAKE (1) TABLET BY MOUTH ONCE DAILY. 30 tablet 0  . atorvastatin (LIPITOR) 10 MG tablet TAKE  ONE TABLET BY MOUTH ONCE DAILY. 30 tablet 0  . clotrimazole-betamethasone (LOTRISONE) cream Apply 1 application topically 2 (two) times daily. 45 g 1  . cyclobenzaprine (FLEXERIL) 10 MG tablet TAKE (1) TABLET BY MOUTH ONCE DAILY AS NEEDED FOR MUSCLE SPASMS. 30 tablet 0  . ergocalciferol (VITAMIN D2) 1.25 MG (50000 UT) capsule Take 1 capsule (50,000 Units total) by mouth once a week. One capsule once weekly 12 capsule 1  . IBU 800 MG tablet TAKE 1 TABLET BY MOUTH ONCE EVERY 8 HOURS AS NEEDED. 30 tablet 0  . meloxicam (MOBIC) 15 MG tablet Take 15 mg by mouth daily.    Marland Kitchen spironolactone (ALDACTONE) 50 MG tablet Take 1 tablet (50 mg total) by mouth daily. 90 tablet 1   No facility-administered medications prior to visit.    No Known Allergies  Review of Systems  Constitutional: Negative.   Respiratory: Positive for cough. Negative for chest tightness, shortness of breath and wheezing.   Cardiovascular: Negative.        Objective:    Physical Exam Constitutional:      Appearance: Normal appearance.  Cardiovascular:     Rate and Rhythm: Normal rate and regular  rhythm.     Pulses: Normal pulses.     Heart sounds: Normal heart sounds.  Pulmonary:     Effort: Pulmonary effort is normal.     Breath sounds: Normal breath sounds.  Neurological:     Mental Status: He is alert.  Psychiatric:        Mood and Affect: Mood normal.        Behavior: Behavior normal.        Thought Content: Thought content normal.        Judgment: Judgment normal.     BP (!) 152/81 (BP Location: Right Arm, Patient Position: Sitting, Cuff Size: Normal)   Pulse 80   Temp 98.2 F (36.8 C) (Temporal)   Ht 5' 10"  (1.778 m)   Wt 219 lb (99.3 kg)   SpO2 98%   BMI 31.42 kg/m  Wt Readings from Last 3 Encounters:  07/02/20 219 lb (99.3 kg)  04/24/20 211 lb 1.3 oz (95.7 kg)  03/05/20 212 lb (96.2 kg)    Health Maintenance Due  Topic Date Due  . COLONOSCOPY (Pts 45-16yr Insurance coverage will need to be  confirmed)  Never done  . COVID-19 Vaccine (3 - Booster for Moderna series) 03/28/2020    There are no preventive care reminders to display for this patient.   Lab Results  Component Value Date   TSH 1.230 12/20/2019   Lab Results  Component Value Date   WBC 12.1 (H) 12/20/2019   HGB 14.9 12/20/2019   HCT 45.2 12/20/2019   MCV 89 12/20/2019   PLT 350 12/20/2019   Lab Results  Component Value Date   NA 146 (H) 04/24/2020   K 4.4 04/24/2020   CO2 27 04/24/2020   GLUCOSE 95 04/24/2020   BUN 11 04/24/2020   CREATININE 0.91 04/24/2020   BILITOT 0.4 12/20/2019   ALKPHOS 69 12/20/2019   AST 24 12/20/2019   ALT 28 12/20/2019   PROT 7.3 12/20/2019   ALBUMIN 4.9 12/20/2019   CALCIUM 9.8 04/24/2020   ANIONGAP 7 01/26/2018   Lab Results  Component Value Date   CHOL 171 12/20/2019   Lab Results  Component Value Date   HDL 35 (L) 12/20/2019   Lab Results  Component Value Date   LDLCALC 104 (H) 12/20/2019   Lab Results  Component Value Date   TRIG 184 (H) 12/20/2019   Lab Results  Component Value Date   CHOLHDL 4.9 12/20/2019   Lab Results  Component Value Date   HGBA1C 6.1 (A) 04/24/2020       Assessment & Plan:   Problem List Items Addressed This Visit      Cardiovascular and Mediastinum   Essential hypertension    -BP well controlled today -continue amlodipine and spironolactone      Relevant Orders   CBC with Differential/Platelet   CMP14+EGFR   Lipid Panel With LDL/HDL Ratio     Other   Prediabetes   Relevant Orders   Hemoglobin A1c   Cigarette smoker motivated to quit    -he states he quit smoking for about a month, but he started smoking again today -we discussed using nicotine gum       Other Visit Diagnoses    Vitamin D deficiency    -  Primary   Relevant Orders   Vitamin D (25 hydroxy)       No orders of the defined types were placed in this encounter.    JNoreene Larsson NP

## 2020-07-02 NOTE — Assessment & Plan Note (Signed)
-  BP well controlled today -continue amlodipine and spironolactone

## 2020-07-13 NOTE — Progress Notes (Signed)
CARDIOLOGY CONSULT NOTE       Patient ID: Evan Black MRN: [id number] DOB/AGE: 10-06-1970 50 y.o.  Admit date: (Not on file) Referring Physician: Moshe Cipro Primary Physician: Fayrene Helper, MD Primary Cardiologist: New Reason for Consultation: Abnormal ECG  Active Problems:   * No active hospital problems. *   HPI:  50 y.o. referred by Dr Moshe Cipro for abnormal ECG. 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 Wife works at [employer] in [employer] They have been sweethearts since 2 nd grade One son lives in [place] and cuts hair as well  Not active but no chest pain , dyspnea, palpitations or edema  Has had COVID vaccine   ROS All other systems reviewed and negative except as noted above  Past Medical History:  Diagnosis Date  . Hypertension     Family History  Problem Relation Age of Onset  . Hypertension Mother     Social History   Socioeconomic History  . Marital status: Married    Spouse name: Not on file  . Number of children: Not on file  . Years of education: Not on file  . Highest education level: Not on file  Occupational History  . Not on file  Tobacco Use  . Smoking status: Light Tobacco Smoker    Packs/day: 0.25    Years: 40.00    Pack years: 10.00    Types: Cigarettes  . Smokeless tobacco: Never Used  . Tobacco comment: smokes 2-3 cigarettes per day 03/05/2020  Vaping Use  . Vaping Use: Never used  Substance and Sexual Activity  . Alcohol use: Yes    Alcohol/week: 18.0 standard drinks    Types: 18 Cans of beer per week  . Drug use: No  . Sexual activity: Yes  Other Topics Concern  . Not on file  Social History Narrative  . Not on file    Social Determinants of Health   Financial Resource Strain: Not on file  Food Insecurity: Not on file  Transportation Needs: Not on file  Physical Activity: Not on file  Stress: Not on file  Social Connections: Not on file  Intimate Partner Violence: Not on file    Past Surgical History:  Procedure Laterality Date  . BACK SURGERY        Current Outpatient Medications:  .  amLODipine (NORVASC) 10 MG tablet, TAKE (1) TABLET BY MOUTH ONCE DAILY., Disp: 30 tablet, Rfl: 0 .  atorvastatin (LIPITOR) 10 MG tablet, TAKE ONE TABLET BY MOUTH ONCE DAILY., Disp: 30 tablet, Rfl: 0 .  clotrimazole-betamethasone (LOTRISONE) cream, Apply 1 application topically 2 (two) times daily., Disp: 45 g, Rfl: 1 .  cyclobenzaprine (FLEXERIL) 10 MG tablet, TAKE (1) TABLET BY MOUTH ONCE DAILY AS NEEDED FOR MUSCLE SPASMS., Disp: 30 tablet, Rfl: 0 .  ergocalciferol (VITAMIN D2) 1.25 MG (50000 UT) capsule, Take 1 capsule (50,000 Units total) by mouth once a week. One capsule once weekly, Disp: 12 capsule, Rfl: 1 .  IBU 800 MG tablet, TAKE 1 TABLET BY MOUTH ONCE EVERY 8 HOURS AS NEEDED., Disp: 30 tablet, Rfl: 0 .  meloxicam (MOBIC) 15 MG tablet, Take 15 mg by mouth daily., Disp: ,  Rfl:  .  spironolactone (ALDACTONE) 50 MG tablet, Take 1 tablet (50 mg total) by mouth daily., Disp: 90 tablet, Rfl: 1    Physical Exam: Blood pressure (!) 152/80, pulse 87, height 5\' 10"  (1.778 m), weight 99.3 kg, SpO2 95 %.    Affect appropriate Healthy:  appears stated age 50: normal Neck supple with no adenopathy JVP normal no bruits no thyromegaly Lungs clear with no wheezing and good diaphragmatic motion Heart:  S1/S2 no murmur, no rub, gallop or click PMI normal Abdomen: benighn, BS positve, no tenderness, no AAA no bruit.  No HSM or HJR Distal pulses intact with no bruits No edema Neuro non-focal Skin warm and dry No muscular weakness   Labs:   Lab Results  Component Value Date   WBC 12.1 (H) 12/20/2019    HGB 14.9 12/20/2019   HCT 45.2 12/20/2019   MCV 89 12/20/2019   PLT 350 12/20/2019   No results for input(s): NA, K, CL, CO2, BUN, CREATININE, CALCIUM, PROT, BILITOT, ALKPHOS, ALT, AST, GLUCOSE in the last 168 hours.  Invalid input(s): LABALBU No results found for: CKTOTAL, CKMB, CKMBINDEX, TROPONINI  Lab Results  Component Value Date   CHOL 171 12/20/2019   CHOL 139 11/24/2018   CHOL 113 03/07/2017   Lab Results  Component Value Date   HDL 35 (L) 12/20/2019   HDL 30 (L) 11/24/2018   HDL 26 (L) 03/07/2017   Lab Results  Component Value Date   LDLCALC 104 (H) 12/20/2019   LDLCALC 80 11/24/2018   LDLCALC 64 03/07/2017   Lab Results  Component Value Date   TRIG 184 (H) 12/20/2019   TRIG 193 (H) 11/24/2018   TRIG 150 (H) 03/07/2017   Lab Results  Component Value Date   CHOLHDL 4.9 12/20/2019   CHOLHDL 4.6 11/24/2018   CHOLHDL 4.3 03/07/2017   Lab Results  Component Value Date   LDLDIRECT 86 06/01/2014      Radiology: No results found.  EKG: See HPI    ASSESSMENT AND PLAN:   1. Abnormal ECG :  Nonspecific and likely normal for him f/u echo to r/o structural heart disease Given Risk factors discussed benefit of coronary calcium score to risk stratify for CAD 2. HTN:  Well controlled.  Continue current medications and low sodium Dash type diet.   3. Smoking:  Counseled on cessation < 10 minutes no CXR since 2019 will order and consider lung cancer Screening CT starting at age 56 4. HLD:  On statin labs with primary   If calcium score 0 and echo ok will see PRN  Signed: Jenkins Rouge 07/23/2020, 9:18 AM

## 2020-07-23 ENCOUNTER — Ambulatory Visit: Payer: BC Managed Care – PPO | Admitting: Cardiovascular Disease

## 2020-07-23 ENCOUNTER — Encounter: Payer: Self-pay | Admitting: Cardiovascular Disease

## 2020-07-23 ENCOUNTER — Other Ambulatory Visit: Payer: Self-pay

## 2020-07-23 VITALS — BP 152/80 | HR 87 | Ht 70.0 in | Wt 219.0 lb

## 2020-07-23 DIAGNOSIS — R9431 Abnormal electrocardiogram [ECG] [EKG]: Secondary | ICD-10-CM | POA: Diagnosis not present

## 2020-07-23 NOTE — Patient Instructions (Signed)
Medication Instructions:  Your physician recommends that you continue on your current medications as directed. Please refer to the Current Medication list given to you today.  *If you need a refill on your cardiac medications before your next appointment, please call your pharmacy*   Lab Work: NONE   If you have labs (blood work) drawn today and your tests are completely normal, you will receive your results only by: Marland Kitchen MyChart Message (if you have MyChart) OR . A paper copy in the mail If you have any lab test that is abnormal or we need to change your treatment, we will call you to review the results.   Testing/Procedures: Your physician has requested that you have an echocardiogram. Echocardiography is a painless test that uses sound waves to create images of your heart. It provides your doctor with information about the size and shape of your heart and how well your heart's chambers and valves are working. This procedure takes approximately one hour. There are no restrictions for this procedure. CT Cardiac Scoring Test   Follow-Up: At [facility], you and your health needs are our priority.  As part of our continuing mission to provide you with exceptional heart care, we have created designated Provider Care Teams.  These Care Teams include your primary Cardiologist (physician) and Advanced Practice Providers (APPs -  Physician Assistants and Nurse Practitioners) who all work together to provide you with the care you need, when you need it.  We recommend signing up for the patient portal called "MyChart".  Sign up information is provided on this After Visit Summary.  MyChart is used to connect with patients for Virtual Visits (Telemedicine).  Patients are able to view lab/test results, encounter notes, upcoming appointments, etc.  Non-urgent messages can be sent to your provider as well.   To learn more about what you can do with MyChart, go to [email or web address].    Your next  appointment:    As Needed   The format for your next appointment:   In Person  Provider:   Jenkins Rouge, MD   Other Instructions Thank you for choosing [facility]!

## 2020-07-24 ENCOUNTER — Encounter: Payer: Self-pay | Admitting: Family Medicine

## 2020-07-24 ENCOUNTER — Telehealth (INDEPENDENT_AMBULATORY_CARE_PROVIDER_SITE_OTHER): Payer: BC Managed Care – PPO | Admitting: Family Medicine

## 2020-07-24 ENCOUNTER — Other Ambulatory Visit: Payer: BC Managed Care – PPO

## 2020-07-24 DIAGNOSIS — Z20822 Contact with and (suspected) exposure to covid-19: Secondary | ICD-10-CM

## 2020-07-24 DIAGNOSIS — U071 COVID-19: Secondary | ICD-10-CM | POA: Diagnosis not present

## 2020-07-24 NOTE — Patient Instructions (Addendum)
F/U on 07/30/2020, re evaluate Covid  Work excuse from 02/15, and I anticipate a return to work date of 07/31/2020, need visit on 48/18/5631 to be certain  Please send in lab report of positive test  Self isolate at home for next 5 days to reduce risk of spread please  10 Things You Can Do to Manage Your COVID-19 Symptoms at Home If you have possible or confirmed COVID-19: 1. Stay home except to get medical care. 2. Monitor your symptoms carefully. If your symptoms get worse, call your healthcare provider immediately. 3. Get rest and stay hydrated. 4. If you have a medical appointment, call the healthcare provider ahead of time and tell them that you have or may have COVID-19. 5. For medical emergencies, call 911 and notify the dispatch personnel that you have or may have COVID-19. 6. Cover your cough and sneezes with a tissue or use the inside of your elbow. 7. Wash your hands often with soap and water for at least 20 seconds or clean your hands with an alcohol-based hand sanitizer that contains at least 60% alcohol. 8. As much as possible, stay in a specific room and away from other people in your home. Also, you should use a separate bathroom, if available. If you need to be around other people in or outside of the home, wear a mask. 9. Avoid sharing personal items with other people in your household, like dishes, towels, and bedding. 10. Clean all surfaces that are touched often, like counters, tabletops, and doorknobs. Use household cleaning sprays or wipes according to the label instructions. [email or web address] 12/23/2019 This information is not intended to replace advice given to you by your health care provider. Make sure you discuss any questions you have with your health care provider. Document Revised: 04/09/2020 Document Reviewed: 04/09/2020 Elsevier Patient Education  [place] Under Monitoring Name: Evan Black  Location: [address] [place] [address]   Infection Prevention Recommendations for Individuals Confirmed to have, or Being Evaluated for, 2019 Novel Coronavirus (COVID-19) Infection Who Receive Care at Home  Individuals who are confirmed to have, or are being evaluated for, COVID-19 should follow the prevention steps below until a healthcare provider or local or state health department says they can return to normal activities.  Stay home except to get medical care You should restrict activities outside your home, except for getting medical care. Do not go to work, school, or public areas, and do not use public transportation or taxis.  Call ahead before visiting your doctor Before your medical appointment, call the healthcare provider and tell them that you have, or are being evaluated for, COVID-19 infection. This will help the healthcare provider's office take steps to keep other people from getting infected. Ask your healthcare provider to call the local or state health department.  Monitor your symptoms Seek prompt medical attention if your illness is worsening (e.g., difficulty breathing). Before going to your medical appointment, call the healthcare provider and tell them that you have, or are being evaluated for, COVID-19 infection. Ask your healthcare provider to call the local or state health department.  Wear a facemask You should wear a facemask that covers your nose and mouth when you are in the same room with other people and when you visit a healthcare provider. People who live with or visit you should also wear a facemask while they are in the same room with you.  Separate yourself from other people in your home  As much as possible, you should stay in a different room from other people in your home. Also, you should use a separate bathroom, if available.  Avoid sharing household items You should not share dishes, drinking glasses, cups, eating utensils, towels, bedding, or other items with other  people in your home. After using these items, you should wash them thoroughly with soap and water.  Cover your coughs and sneezes Cover your mouth and nose with a tissue when you cough or sneeze, or you can cough or sneeze into your sleeve. Throw used tissues in a lined trash can, and immediately wash your hands with soap and water for at least 20 seconds or use an alcohol-based hand rub.  Wash your [facility] your hands often and thoroughly with soap and water for at least 20 seconds. You can use an alcohol-based hand sanitizer if soap and water are not available and if your hands are not visibly dirty. Avoid touching your eyes, nose, and mouth with unwashed hands.   Prevention Steps for Caregivers and Household Members of Individuals Confirmed to have, or Being Evaluated for, COVID-19 Infection Being Cared for in the Home  If you live with, or provide care at home for, a person confirmed to have, or being evaluated for, COVID-19 infection please follow these guidelines to prevent infection:  Follow healthcare provider's instructions Make sure that you understand and can help the patient follow any healthcare provider instructions for all care.  Provide for the patient's basic needs You should help the patient with basic needs in the home and provide support for getting groceries, prescriptions, and other personal needs.  Monitor the patient's symptoms If they are getting sicker, call his or her medical provider and tell them that the patient has, or is being evaluated for, COVID-19 infection. This will help the healthcare provider's office take steps to keep other people from getting infected. Ask the healthcare provider to call the local or state health department.  Limit the number of people who have contact with the patient  If possible, have only one caregiver for the patient.  Other household members should stay in another home or place of residence. If this is not  possible, they should stay  in another room, or be separated from the patient as much as possible. Use a separate bathroom, if available.  Restrict visitors who do not have an essential need to be in the home.  Keep older adults, very young children, and other sick people away from the patient Keep older adults, very young children, and those who have compromised immune systems or chronic health conditions away from the patient. This includes people with chronic heart, lung, or kidney conditions, diabetes, and cancer.  Ensure good ventilation Make sure that shared spaces in the home have good air flow, such as from an air conditioner or an opened window, weather permitting.  Wash your hands often  Wash your hands often and thoroughly with soap and water for at least 20 seconds. You can use an alcohol based hand sanitizer if soap and water are not available and if your hands are not visibly dirty.  Avoid touching your eyes, nose, and mouth with unwashed hands.  Use disposable paper towels to dry your hands. If not available, use dedicated cloth towels and replace them when they become wet.  Wear a facemask and gloves  Wear a disposable facemask at all times in the room and gloves when you touch or have contact with the patient's  blood, body fluids, and/or secretions or excretions, such as sweat, saliva, sputum, nasal mucus, vomit, urine, or feces.  Ensure the mask fits over your nose and mouth tightly, and do not touch it during use.  Throw out disposable facemasks and gloves after using them. Do not reuse.  Wash your hands immediately after removing your facemask and gloves.  If your personal clothing becomes contaminated, carefully remove clothing and launder. Wash your hands after handling contaminated clothing.  Place all used disposable facemasks, gloves, and other waste in a lined container before disposing them with other household waste.  Remove gloves and wash your hands  immediately after handling these items.  Do not share dishes, glasses, or other household items with the patient  Avoid sharing household items. You should not share dishes, drinking glasses, cups, eating utensils, towels, bedding, or other items with a patient who is confirmed to have, or being evaluated for, COVID-19 infection.  After the person uses these items, you should wash them thoroughly with soap and water.  Wash laundry thoroughly  Immediately remove and wash clothes or bedding that have blood, body fluids, and/or secretions or excretions, such as sweat, saliva, sputum, nasal mucus, vomit, urine, or feces, on them.  Wear gloves when handling laundry from the patient.  Read and follow directions on labels of laundry or clothing items and detergent. In general, wash and dry with the warmest temperatures recommended on the label.  Clean all areas the individual has used often  Clean all touchable surfaces, such as counters, tabletops, doorknobs, bathroom fixtures, toilets, phones, keyboards, tablets, and bedside tables, every day. Also, clean any surfaces that may have blood, body fluids, and/or secretions or excretions on them.  Wear gloves when cleaning surfaces the patient has come in contact with.  Use a diluted bleach solution (e.g., dilute bleach with 1 part bleach and 10 parts water) or a household disinfectant with a label that says EPA-registered for coronaviruses. To make a bleach solution at home, add 1 tablespoon of bleach to 1 quart (4 cups) of water. For a larger supply, add  cup of bleach to 1 gallon (16 cups) of water.  Read labels of cleaning products and follow recommendations provided on product labels. Labels contain instructions for safe and effective use of the cleaning product including precautions you should take when applying the product, such as wearing gloves or eye protection and making sure you have good ventilation during use of the product.  Remove  gloves and wash hands immediately after cleaning.  Monitor yourself for signs and symptoms of illness Caregivers and household members are considered close contacts, should monitor their health, and will be asked to limit movement outside of the home to the extent possible. Follow the monitoring steps for close contacts listed on the symptom monitoring form.   ? If you have additional questions, contact your local health department or call the epidemiologist on call at [phone] (available 24/7). ? This guidance is subject to change. For the most up-to-date guidance from [facility], please refer to their website: [email or web address]

## 2020-07-24 NOTE — Progress Notes (Signed)
Virtual Visit via Telephone Note  I connected with Lattie Corns on 07/24/20 at  3:20 PM EST by telephone and verified that I am speaking with the correct person using two identifiers.  Location: Patient: home Provider: office   I discussed the limitations, risks, security and privacy concerns of performing an evaluation and management service by telephone and the availability of in person appointments. I also discussed with the patient that there may be a patient responsible charge related to this service. The patient expressed understanding and agreed to proceed.   History of Present Illness: Exposed unknowingly to Covid positive  elderly male 6 days ago, and started having fever , chills and body aches yesterday, he had a rapid test today and this is positive for covid, he is not at work today and was  out yesterday at a Doctor' appointment   Observations/Objective: There were no vitals taken for this visit. Good communication with no confusion and intact memory. Alert and oriented x 3, sounds congested and ill No signs of respiratory distress during speech   Assessment and Plan: Lab test positive for detection of COVID-19 virus Symptomatic after  Exposure to covid positive patient with rapid test reported positive on 07/24/2020, re evaluate in 1 week , out of work until that date Pt education re home management and symptoms to look for in case of deterioration and the need to go to the ED '  Follow Up Instructions:    I discussed the assessment and treatment plan with the patient. The patient was provided an opportunity to ask questions and all were answered. The patient agreed with the plan and demonstrated an understanding of the instructions.   The patient was advised to call back or seek an in-person evaluation if the symptoms worsen or if the condition fails to improve as anticipated.  I provided 11  minutes of non-face-to-face time during this encounter.   Tula Nakayama, MD

## 2020-07-25 ENCOUNTER — Other Ambulatory Visit: Payer: Self-pay

## 2020-07-25 ENCOUNTER — Encounter: Payer: Self-pay | Admitting: Family Medicine

## 2020-07-25 ENCOUNTER — Encounter (INDEPENDENT_AMBULATORY_CARE_PROVIDER_SITE_OTHER): Payer: Self-pay | Admitting: *Deleted

## 2020-07-25 DIAGNOSIS — Z1211 Encounter for screening for malignant neoplasm of colon: Secondary | ICD-10-CM

## 2020-07-25 LAB — SARS-COV-2, NAA 2 DAY TAT

## 2020-07-25 LAB — NOVEL CORONAVIRUS, NAA: SARS-CoV-2, NAA: DETECTED — AB

## 2020-07-26 ENCOUNTER — Telehealth: Payer: Self-pay | Admitting: Oncology

## 2020-07-26 ENCOUNTER — Encounter: Payer: Self-pay | Admitting: Oncology

## 2020-07-26 NOTE — Telephone Encounter (Signed)
Called to discuss with patient about COVID-19 symptoms and the use of one of the available treatments for those with mild to moderate Covid symptoms and at a high risk of hospitalization.  Pt appears to qualify for outpatient treatment due to co-morbid conditions and/or a member of an at-risk group in accordance with the FDA Emergency Use Authorization.    Symptom onset: 07/24/20 Vaccinated: Yes Booster? No Immunocompromised? No Qualifiers:  Past Medical History:  Diagnosis Date  . Hypertension    Obesity Smoker  Spoke to patient who believes he is improving and does need treatment. Information sent via MyChart if needs [place].   Jacquelin Hawking

## 2020-07-27 ENCOUNTER — Other Ambulatory Visit: Payer: Self-pay | Admitting: Family Medicine

## 2020-07-29 ENCOUNTER — Encounter: Payer: Self-pay | Admitting: Family Medicine

## 2020-07-29 DIAGNOSIS — U071 COVID-19: Secondary | ICD-10-CM

## 2020-07-29 HISTORY — DX: COVID-19: U07.1

## 2020-07-29 NOTE — Assessment & Plan Note (Signed)
Symptomatic after  Exposure to covid positive patient with rapid test reported positive on 07/24/2020, re evaluate in 1 week , out of work until that date Pt education re home management and symptoms to look for in case of deterioration and the need to go to the ED

## 2020-07-30 ENCOUNTER — Other Ambulatory Visit: Payer: Self-pay

## 2020-07-30 ENCOUNTER — Telehealth (INDEPENDENT_AMBULATORY_CARE_PROVIDER_SITE_OTHER): Payer: BC Managed Care – PPO | Admitting: Family Medicine

## 2020-07-30 ENCOUNTER — Encounter: Payer: Self-pay | Admitting: Family Medicine

## 2020-07-30 DIAGNOSIS — F17210 Nicotine dependence, cigarettes, uncomplicated: Secondary | ICD-10-CM

## 2020-07-30 DIAGNOSIS — M2550 Pain in unspecified joint: Secondary | ICD-10-CM | POA: Diagnosis not present

## 2020-07-30 DIAGNOSIS — G8929 Other chronic pain: Secondary | ICD-10-CM

## 2020-07-30 DIAGNOSIS — I10 Essential (primary) hypertension: Secondary | ICD-10-CM

## 2020-07-30 DIAGNOSIS — U071 COVID-19: Secondary | ICD-10-CM | POA: Diagnosis not present

## 2020-07-30 DIAGNOSIS — U099 Post covid-19 condition, unspecified: Secondary | ICD-10-CM | POA: Insufficient documentation

## 2020-07-30 MED ORDER — PREDNISONE 10 MG PO TABS: 10.0000 mg | ORAL_TABLET | Freq: Two times a day (BID) | ORAL | 0 refills | Status: DC

## 2020-07-30 NOTE — Patient Instructions (Addendum)
F/U as before , call if you need me sooner   Work excuse to return on 08/01/2020 ( may collect today please)  5 day course ofpredniosne is prescribed for shoulder pain, also take ES tylenol 500 mg one twice daily for 5 days along with this  PLEASE DO GET your covid booster, already scheduled for August 15, 2020  CONGRATS on smoking ONLY 2 cigarettes since 2022, PLEASE DO NOT start smoking again   Managing the Challenge of Quitting Smoking Quitting smoking is a physical and mental challenge. You will face cravings, withdrawal symptoms, and temptation. Before quitting, work with your health care provider to make a plan that can help you manage quitting. Preparation can help you quit and keep you from giving in. How to manage lifestyle changes Managing stress Stress can make you want to smoke, and wanting to smoke may cause stress. It is important to find ways to manage your stress. You might try some of the following:  Practice relaxation techniques. ? Breathe slowly and deeply, in through your nose and out through your mouth. ? Listen to music. ? Soak in a bath or take a shower. ? Imagine a peaceful place or vacation.  Get some support. ? Talk with family or friends about your stress. ? Join a support group. ? Talk with a counselor or therapist.  Get some physical activity. ? Go for a walk, run, or bike ride. ? Play a favorite sport. ? Practice yoga.   Medicines Talk with your health care provider about medicines that might help you deal with cravings and make quitting easier for you. Relationships Social situations can be difficult when you are quitting smoking. To manage this, you can:  Avoid parties and other social situations where people might be smoking.  Avoid alcohol.  Leave right away if you have the urge to smoke.  Explain to your family and friends that you are quitting smoking. Ask for support and let them know you might be a bit grumpy.  Plan activities where  smoking is not an option. General instructions Be aware that many people gain weight after they quit smoking. However, not everyone does. To keep from gaining weight, have a plan in place before you quit and stick to the plan after you quit. Your plan should include:  Having healthy snacks. When you have a craving, it may help to: ? Eat popcorn, carrots, celery, or other cut vegetables. ? Chew sugar-free gum.  Changing how you eat. ? Eat small portion sizes at meals. ? Eat 4-6 small meals throughout the day instead of 1-2 large meals a day. ? Be mindful when you eat. Do not watch television or do other things that might distract you as you eat.  Exercising regularly. ? Make time to exercise each day. If you do not have time for a long workout, do short bouts of exercise for 5-10 minutes several times a day. ? Do some form of strengthening exercise, such as weight lifting. ? Do some exercise that gets your heart beating and causes you to breathe deeply, such as walking fast, running, swimming, or biking. This is very important.  Drinking plenty of water or other low-calorie or no-calorie drinks. Drink 6-8 glasses of water daily.   How to recognize withdrawal symptoms Your body and mind may experience discomfort as you try to get used to not having nicotine in your system. These effects are called withdrawal symptoms. They may include:  Feeling hungrier than normal.  Having trouble  concentrating.  Feeling irritable or restless.  Having trouble sleeping.  Feeling depressed.  Craving a cigarette. To manage withdrawal symptoms:  Avoid places, people, and activities that trigger your cravings.  Remember why you want to quit.  Get plenty of sleep.  Avoid coffee and other caffeinated drinks. These may worsen some of your symptoms. These symptoms may surprise you. But be assured that they are normal to have when quitting smoking. How to manage cravings Come up with a plan for how  to deal with your cravings. The plan should include the following:  A definition of the specific situation you want to deal with.  An alternative action you will take.  A clear idea for how this action will help.  The name of someone who might help you with this. Cravings usually last for 5-10 minutes. Consider taking the following actions to help you with your plan to deal with cravings:  Keep your mouth busy. ? Chew sugar-free gum. ? Suck on hard candies or a straw. ? Brush your teeth.  Keep your hands and body busy. ? Change to a different activity right away. ? Squeeze or play with a ball. ? Do an activity or a hobby, such as making bead jewelry, practicing needlepoint, or working with wood. ? Mix up your normal routine. ? Take a short exercise break. Go for a quick walk or run up and down stairs.  Focus on doing something kind or helpful for someone else.  Call a friend or family member to talk during a craving.  Join a support group.  Contact a quitline. Where to find support To get help or find a support group:  Call the [facility]'s Smoking Quitline: 1-800-QUIT NOW 941-678-5245)  Visit the website of the Substance Abuse and [place]: ktimeonline.com  Text QUIT to SmokefreeTXT: 263785 Where to find more information Visit these websites to find more information on quitting smoking:  [place]: www.smokefree.gov  American Lung Association: www.lung.org  American Cancer Society: www.cancer.org  Centers for Disease Control and Prevention: http://www.wolf.info/  American Heart Association: www.heart.org Contact a health care provider if:  You want to change your plan for quitting.  The medicines you are taking are not helping.  Your eating feels out of control or you cannot sleep. Get help right away if:  You feel depressed or become very anxious. Summary  Quitting smoking is a physical and mental challenge.  You will face cravings, withdrawal symptoms, and temptation to smoke again. Preparation can help you as you go through these challenges.  Try different techniques to manage stress, handle social situations, and prevent weight gain.  You can deal with cravings by keeping your mouth busy (such as by chewing gum), keeping your hands and body busy, calling family or friends, or contacting a quitline for people who want to quit smoking.  You can deal with withdrawal symptoms by avoiding places where people smoke, getting plenty of rest, and avoiding drinks with caffeine. This information is not intended to replace advice given to you by your health care provider. Make sure you discuss any questions you have with your health care provider. Document Revised: 03/15/2019 Document Reviewed: 03/15/2019 Elsevier Patient Education  [place].

## 2020-07-30 NOTE — Assessment & Plan Note (Signed)
Bilateral shoulder joint pains rated at a 5 following recent covid infection, 5 day course of prednisone is prescribed, he is to take ES tylenol one twice daily also for 5 days, return to work on 08/01/2020

## 2020-07-31 ENCOUNTER — Ambulatory Visit (HOSPITAL_COMMUNITY)
Admission: RE | Admit: 2020-07-31 | Discharge: 2020-07-31 | Disposition: A | Discharge status: Home / Self Care | Payer: BC Managed Care – PPO | Source: Ambulatory Visit | Attending: Cardiovascular Disease | Admitting: Cardiovascular Disease

## 2020-07-31 ENCOUNTER — Other Ambulatory Visit: Payer: Self-pay

## 2020-07-31 DIAGNOSIS — R9431 Abnormal electrocardiogram [ECG] [EKG]: Secondary | ICD-10-CM

## 2020-07-31 LAB — ECHOCARDIOGRAM COMPLETE
Area-P 1/2: 4.24 cm2
S' Lateral: 3.2 cm

## 2020-07-31 NOTE — Progress Notes (Signed)
*  PRELIMINARY RESULTS* Echocardiogram 2D Echocardiogram has been performed.  Evan Black 07/31/2020, 3:46 PM

## 2020-08-02 ENCOUNTER — Telehealth: Payer: Self-pay

## 2020-08-02 ENCOUNTER — Encounter: Payer: Self-pay | Admitting: Family Medicine

## 2020-08-02 NOTE — Progress Notes (Signed)
Virtual Visit via Video Note  I connected with Lattie Corns on 08/02/20 at  2:40 PM EST by a video enabled telemedicine application and verified that I am speaking with the correct person using two identifiers.  Location: Patient: home Provider: office   I discussed the limitations of evaluation and management by telemedicine and the availability of in person appointments. The patient expressed understanding and agreed to proceed.  History of Present Illness: F/u covid infection diagnosed 07/24/2020, Denies fever , chills , cough or shortness of breath currently C/o bilateral shoulder , hip and back pain , following generalized body pain Has cardiology test in am and requests return to work following this Nicotine free since June 10, 2020   Observations/Objective: Ht 5\' 10"  (1.778 m)   Wt 206 lb (93.4 kg)   BMI 29.56 kg/m  Good communication with no confusion and intact memory. 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 The importance of attaining a healthy weight is also discussed.  BP/Weight 07/30/2020 07/23/2020 07/02/2020 04/24/2020 03/05/2020 02/28/2020 2/44/9753  Systolic BP - 005 110 211 173 567 014  Diastolic BP - 80 81 90 88 80 84  Wt. (Lbs) 206 219 219 211.08 212 212 212.12  BMI 29.56 31.42 31.42 30.29 30.42 30.42 30.44       Cigarette smoker motivated to quit Quit 06/2020, he is applauded on this    Follow Up Instructions:    I discussed the  assessment and treatment plan with the patient. The patient was provided an opportunity to ask questions and all were answered. The patient agreed with the plan and demonstrated an understanding of the instructions.   The patient was advised to call back or seek an in-person evaluation if the symptoms worsen or if the condition fails to improve as anticipated.  I provided 18 minutes of non-face-to-face time during this encounter.   Tula Nakayama, MD

## 2020-08-02 NOTE — Assessment & Plan Note (Signed)
Quit 06/2020, he is applauded on this

## 2020-08-02 NOTE — Telephone Encounter (Signed)
-----   Message from Josue Hector, MD sent at 07/31/2020  5:36 PM EST ----- Normal echo with good EF and no significant valvular heart disease

## 2020-08-02 NOTE — Assessment & Plan Note (Signed)
Controlled, no change in medication DASH diet and commitment to daily physical activity for a minimum of 30 minutes discussed and encouraged, as a part of hypertension management. The importance of attaining a healthy weight is also discussed.  BP/Weight 07/30/2020 07/23/2020 07/02/2020 04/24/2020 03/05/2020 02/28/2020 5/89/4834  Systolic BP - 758 307 460 029 847 308  Diastolic BP - 80 81 90 88 80 84  Wt. (Lbs) 206 219 219 211.08 212 212 212.12  BMI 29.56 31.42 31.42 30.29 30.42 30.42 30.44

## 2020-08-02 NOTE — Telephone Encounter (Signed)
Spoke to patients spouse and she verbalized understanding of result note. She had no questions or concerns as of now.

## 2020-08-03 ENCOUNTER — Other Ambulatory Visit: Payer: Self-pay | Admitting: Family Medicine

## 2020-08-07 ENCOUNTER — Other Ambulatory Visit: Payer: Self-pay

## 2020-08-07 ENCOUNTER — Ambulatory Visit (INDEPENDENT_AMBULATORY_CARE_PROVIDER_SITE_OTHER)
Admission: RE | Admit: 2020-08-07 | Discharge: 2020-08-07 | Disposition: A | Discharge status: Home / Self Care | Payer: Self-pay | Source: Ambulatory Visit | Attending: Cardiovascular Disease | Admitting: Cardiovascular Disease

## 2020-08-07 DIAGNOSIS — R9431 Abnormal electrocardiogram [ECG] [EKG]: Secondary | ICD-10-CM

## 2020-08-08 ENCOUNTER — Telehealth: Payer: Self-pay | Admitting: Cardiovascular Disease

## 2020-08-08 NOTE — Telephone Encounter (Signed)
Patient's wife is aware of results.

## 2020-08-08 NOTE — Telephone Encounter (Signed)
Patient's wife returning call for CT results.

## 2020-08-10 ENCOUNTER — Ambulatory Visit: Payer: BC Managed Care – PPO

## 2020-08-15 ENCOUNTER — Ambulatory Visit: Payer: BC Managed Care – PPO

## 2020-09-01 ENCOUNTER — Other Ambulatory Visit: Payer: Self-pay | Admitting: Family Medicine

## 2020-10-03 ENCOUNTER — Telehealth (INDEPENDENT_AMBULATORY_CARE_PROVIDER_SITE_OTHER): Payer: Self-pay

## 2020-10-03 ENCOUNTER — Encounter (INDEPENDENT_AMBULATORY_CARE_PROVIDER_SITE_OTHER): Payer: Self-pay

## 2020-10-03 ENCOUNTER — Other Ambulatory Visit (INDEPENDENT_AMBULATORY_CARE_PROVIDER_SITE_OTHER): Payer: Self-pay

## 2020-10-03 DIAGNOSIS — Z1211 Encounter for screening for malignant neoplasm of colon: Secondary | ICD-10-CM

## 2020-10-03 MED ORDER — PEG 3350-KCL-NA BICARB-NACL 420 G PO SOLR: 4000.0000 mL | ORAL | 0 refills | Status: DC

## 2020-10-03 NOTE — Telephone Encounter (Signed)
Ok to schedule.  Thanks,  Cabrina Shiroma Castaneda Mayorga, MD Gastroenterology and Hepatology [facility]  

## 2020-10-03 NOTE — Telephone Encounter (Signed)
LeighAnn Kynzleigh Bandel, CMA  

## 2020-10-03 NOTE — Telephone Encounter (Signed)
Referring MD/PCP: Moshe Cipro  Procedure: Tcs  Reason/Indication:  Screening  Has patient had this procedure before?  no  If so, when, by whom and where?    Is there a family history of colon cancer?  no  Who?  What age when diagnosed?    Is patient diabetic? If yes, Type 1 or Type 2   no      Does patient have prosthetic heart valve or mechanical valve?  no  Do you have a pacemaker/defibrillator?  no  Has patient ever had endocarditis/atrial fibrillation? no  Have you had a stroke/heart attack last 6 mths? no  Does patient use oxygen? no  Has patient had joint replacement within last 12 months?  no  Is patient constipated or do they take laxatives? no  Does patient have a history of alcohol/drug use?  no  Is patient on blood thinner such as Coumadin, Plavix and/or Aspirin? yes  Do you take medicine for weight loss?  no  For male patients,: do you still have your menstrual cycle? no  Medications: Amlodipine 10 mg daily, atorvastatin 10 mg daily, lotrisone bid, cyclobenzaprine 10 mg daily, Vit D2 once a week, Ibuprofren 800 mg prn, meloxicam 15 mg daily, prednisone 10 mg bid , spironolactone 50 mg daily  Allergies: nkda  Medication Adjustment per Dr Rehman/Dr Jenetta Downer none   Procedure date & time: 10/31/20 at 10:45 am

## 2020-10-13 ENCOUNTER — Other Ambulatory Visit: Payer: Self-pay | Admitting: Family Medicine

## 2020-10-22 ENCOUNTER — Other Ambulatory Visit: Payer: Self-pay | Admitting: Family Medicine

## 2020-10-24 ENCOUNTER — Ambulatory Visit
Admission: EM | Admit: 2020-10-24 | Discharge: 2020-10-24 | Disposition: A | Discharge status: Home / Self Care | Payer: BC Managed Care – PPO | Attending: Family Medicine | Admitting: Family Medicine

## 2020-10-24 ENCOUNTER — Other Ambulatory Visit: Payer: Self-pay

## 2020-10-24 DIAGNOSIS — Z1152 Encounter for screening for COVID-19: Secondary | ICD-10-CM

## 2020-10-24 NOTE — ED Triage Notes (Signed)
Needs covid test

## 2020-10-25 LAB — SARS-COV-2, NAA 2 DAY TAT

## 2020-10-25 LAB — NOVEL CORONAVIRUS, NAA: SARS-CoV-2, NAA: NOT DETECTED

## 2020-10-29 ENCOUNTER — Encounter (INDEPENDENT_AMBULATORY_CARE_PROVIDER_SITE_OTHER): Payer: Self-pay

## 2020-10-29 ENCOUNTER — Telehealth (INDEPENDENT_AMBULATORY_CARE_PROVIDER_SITE_OTHER): Payer: Self-pay

## 2020-10-29 MED ORDER — SUTAB 1479-225-188 MG PO TABS: 1.0000 | ORAL_TABLET | Freq: Once | ORAL | 0 refills | Status: AC

## 2020-10-29 NOTE — Telephone Encounter (Signed)
Evan Black, CMA  

## 2020-10-30 ENCOUNTER — Other Ambulatory Visit (HOSPITAL_COMMUNITY)
Admission: RE | Admit: 2020-10-30 | Discharge: 2020-10-30 | Disposition: A | Discharge status: Home / Self Care | Payer: BC Managed Care – PPO | Source: Ambulatory Visit | Attending: Gastroenterology | Admitting: Gastroenterology

## 2020-10-30 ENCOUNTER — Ambulatory Visit: Payer: BC Managed Care – PPO | Admitting: Family Medicine

## 2020-11-24 ENCOUNTER — Other Ambulatory Visit: Payer: Self-pay | Admitting: Family Medicine

## 2020-11-26 ENCOUNTER — Other Ambulatory Visit: Payer: Self-pay | Admitting: Family Medicine

## 2020-11-26 ENCOUNTER — Encounter: Payer: Self-pay | Admitting: Family Medicine

## 2020-11-26 ENCOUNTER — Other Ambulatory Visit (INDEPENDENT_AMBULATORY_CARE_PROVIDER_SITE_OTHER): Payer: Self-pay

## 2020-11-26 MED ORDER — CYCLOBENZAPRINE HCL 10 MG PO TABS: ORAL_TABLET | ORAL | 0 refills | Status: DC

## 2020-11-26 MED ORDER — ATORVASTATIN CALCIUM 10 MG PO TABS: 1.0000 | ORAL_TABLET | Freq: Every day | ORAL | 0 refills | Status: DC

## 2020-11-26 MED ORDER — PEG 3350-KCL-NA BICARB-NACL 420 G PO SOLR: 4000.0000 mL | ORAL | 0 refills | Status: DC

## 2020-11-26 MED ORDER — AMLODIPINE BESYLATE 10 MG PO TABS: 10.0000 mg | ORAL_TABLET | Freq: Every day | ORAL | 0 refills | Status: DC

## 2020-11-26 MED ORDER — SPIRONOLACTONE 50 MG PO TABS: 50.0000 mg | ORAL_TABLET | Freq: Once | ORAL | 0 refills | Status: DC

## 2020-11-27 ENCOUNTER — Other Ambulatory Visit (HOSPITAL_COMMUNITY): Payer: BC Managed Care – PPO

## 2020-11-27 ENCOUNTER — Other Ambulatory Visit: Payer: Self-pay

## 2020-11-27 MED ORDER — CYCLOBENZAPRINE HCL 10 MG PO TABS: ORAL_TABLET | ORAL | 0 refills | Status: DC

## 2020-11-27 MED ORDER — AMLODIPINE BESYLATE 10 MG PO TABS: 10.0000 mg | ORAL_TABLET | Freq: Every day | ORAL | 0 refills | Status: DC

## 2020-11-27 MED ORDER — ATORVASTATIN CALCIUM 10 MG PO TABS: 1.0000 | ORAL_TABLET | Freq: Every day | ORAL | 0 refills | Status: DC

## 2020-11-28 ENCOUNTER — Ambulatory Visit (HOSPITAL_COMMUNITY)
Admission: RE | Admit: 2020-11-28 | Payer: BC Managed Care – PPO | Source: Home / Self Care | Admitting: Gastroenterology

## 2020-11-28 ENCOUNTER — Encounter (HOSPITAL_COMMUNITY): Admission: RE | Payer: Self-pay | Source: Home / Self Care

## 2020-11-28 SURGERY — COLONOSCOPY WITH PROPOFOL
Anesthesia: Monitor Anesthesia Care

## 2020-12-29 ENCOUNTER — Other Ambulatory Visit: Payer: Self-pay | Admitting: Family Medicine

## 2021-02-02 ENCOUNTER — Other Ambulatory Visit: Payer: Self-pay | Admitting: Family Medicine

## 2021-02-28 ENCOUNTER — Other Ambulatory Visit: Payer: Self-pay | Admitting: Family Medicine

## 2021-02-28 NOTE — Progress Notes (Signed)
Was referred to dr Salley Slaughter anbnd cancelled procedure need to speak with pt

## 2021-03-06 ENCOUNTER — Other Ambulatory Visit: Payer: Self-pay | Admitting: Family Medicine

## 2021-03-12 ENCOUNTER — Other Ambulatory Visit: Payer: Self-pay | Admitting: Family Medicine

## 2021-04-15 ENCOUNTER — Other Ambulatory Visit: Payer: Self-pay | Admitting: Family Medicine

## 2021-05-03 ENCOUNTER — Other Ambulatory Visit: Payer: Self-pay | Admitting: Family Medicine

## 2021-05-18 ENCOUNTER — Other Ambulatory Visit: Payer: Self-pay | Admitting: Family Medicine

## 2021-07-03 ENCOUNTER — Other Ambulatory Visit: Payer: Self-pay | Admitting: Family Medicine

## 2021-07-22 ENCOUNTER — Other Ambulatory Visit: Payer: Self-pay | Admitting: Family Medicine

## 2021-09-16 ENCOUNTER — Other Ambulatory Visit: Payer: Self-pay | Admitting: Family Medicine

## 2021-10-09 ENCOUNTER — Ambulatory Visit (INDEPENDENT_AMBULATORY_CARE_PROVIDER_SITE_OTHER): Payer: BC Managed Care – PPO | Admitting: Family Medicine

## 2021-10-09 ENCOUNTER — Encounter: Payer: Self-pay | Admitting: Family Medicine

## 2021-10-09 VITALS — BP 138/88 | HR 70 | Ht 70.0 in | Wt 213.1 lb

## 2021-10-09 DIAGNOSIS — E785 Hyperlipidemia, unspecified: Secondary | ICD-10-CM

## 2021-10-09 DIAGNOSIS — Z1211 Encounter for screening for malignant neoplasm of colon: Secondary | ICD-10-CM | POA: Diagnosis not present

## 2021-10-09 DIAGNOSIS — I10 Essential (primary) hypertension: Secondary | ICD-10-CM

## 2021-10-09 DIAGNOSIS — E559 Vitamin D deficiency, unspecified: Secondary | ICD-10-CM

## 2021-10-09 DIAGNOSIS — E669 Obesity, unspecified: Secondary | ICD-10-CM

## 2021-10-09 DIAGNOSIS — R7303 Prediabetes: Secondary | ICD-10-CM | POA: Diagnosis not present

## 2021-10-09 DIAGNOSIS — Z23 Encounter for immunization: Secondary | ICD-10-CM | POA: Diagnosis not present

## 2021-10-09 DIAGNOSIS — E66811 Obesity, class 1: Secondary | ICD-10-CM

## 2021-10-09 MED ORDER — SEMAGLUTIDE-WEIGHT MANAGEMENT 0.25 MG/0.5ML ~~LOC~~ SOAJ: 0.2500 mg | SUBCUTANEOUS | 0 refills | Status: DC

## 2021-10-09 MED ORDER — AZELASTINE HCL 0.1 % NA SOLN: 2.0000 | Freq: Two times a day (BID) | NASAL | 12 refills | Status: DC

## 2021-10-09 NOTE — Patient Instructions (Addendum)
Annual exam re evaluate weight in 7 weeks, call if you need nme sooner ? ?Shingrix #2 today ? ? ?CBC, lipid, cmp and EGFr, tSH, Psa, vit D and hBA1C today ? ?New is Astelin nose spray for allergies ? ?New is semaglutide weekly injection for weight loss ? ?NEED colonosciopy, I will refer you again ? ? ? CONGRATS on remaining nicotine and alcohol free ? ? ? ? ?

## 2021-10-11 LAB — TSH: TSH: 1.39 u[IU]/mL (ref 0.450–4.500)

## 2021-10-11 LAB — HEMOGLOBIN A1C
Est. average glucose Bld gHb Est-mCnc: 126 mg/dL
Hgb A1c MFr Bld: 6 % — ABNORMAL HIGH (ref 4.8–5.6)

## 2021-10-11 LAB — LIPID PANEL
Chol/HDL Ratio: 4.2 ratio (ref 0.0–5.0)
Cholesterol, Total: 155 mg/dL (ref 100–199)
HDL: 37 mg/dL — ABNORMAL LOW (ref >39)
LDL Chol Calc (NIH): 84 mg/dL (ref 0–99)
Triglycerides: 198 mg/dL — ABNORMAL HIGH (ref 0–149)
VLDL Cholesterol Cal: 34 mg/dL (ref 5–40)

## 2021-10-11 LAB — CMP14+EGFR
ALT: 43 IU/L (ref 0–44)
AST: 30 IU/L (ref 0–40)
Albumin/Globulin Ratio: 1.8 (ref 1.2–2.2)
Albumin: 4.7 g/dL (ref 3.8–4.9)
Alkaline Phosphatase: 59 IU/L (ref 44–121)
BUN/Creatinine Ratio: 14 (ref 9–20)
BUN: 11 mg/dL (ref 6–24)
Bilirubin Total: 0.4 mg/dL (ref 0.0–1.2)
CO2: 21 mmol/L (ref 20–29)
Calcium: 9.6 mg/dL (ref 8.7–10.2)
Chloride: 102 mmol/L (ref 96–106)
Creatinine, Ser: 0.79 mg/dL (ref 0.76–1.27)
Globulin, Total: 2.6 g/dL (ref 1.5–4.5)
Glucose: 81 mg/dL (ref 70–99)
Potassium: 4.5 mmol/L (ref 3.5–5.2)
Sodium: 141 mmol/L (ref 134–144)
Total Protein: 7.3 g/dL (ref 6.0–8.5)
eGFR: 107 mL/min/{1.73_m2} (ref >59)

## 2021-10-11 LAB — CBC
Hematocrit: 40 % (ref 37.5–51.0)
Hemoglobin: 13.9 g/dL (ref 13.0–17.7)
MCH: 29.8 pg (ref 26.6–33.0)
MCHC: 34.8 g/dL (ref 31.5–35.7)
MCV: 86 fL (ref 79–97)
Platelets: 312 10*3/uL (ref 150–450)
RBC: 4.66 x10E6/uL (ref 4.14–5.80)
RDW: 13.6 % (ref 11.6–15.4)
WBC: 9.4 10*3/uL (ref 3.4–10.8)

## 2021-10-11 LAB — PSA: Prostate Specific Ag, Serum: 0.3 ng/mL (ref 0.0–4.0)

## 2021-10-11 LAB — VITAMIN D 25 HYDROXY (VIT D DEFICIENCY, FRACTURES): Vit D, 25-Hydroxy: 32.8 ng/mL (ref 30.0–100.0)

## 2021-10-13 ENCOUNTER — Encounter: Payer: Self-pay | Admitting: Family Medicine

## 2021-10-13 NOTE — Progress Notes (Signed)
? ?Evan Black     MRN: [id number]      DOB: 07/31/1970 ? ? ?HPI ?Evan Black is here for follow up and re-evaluation of chronic medical conditions, medication management and review of any available recent lab and radiology data.  ?Preventive health is updated, specifically  Cancer screening and Immunization.   ?Questions or concerns regarding consultations or procedures which the PT has had in the interim are  addressed. ?The PT denies any adverse reactions to current medications since the last visit.  ?There are no new concerns.  ?There are no specific complaints  ? ?ROS ?Denies recent fever or chills. ?Denies sinus pressure, nasal congestion, ear pain or sore throat. ?Denies chest congestion, productive cough or wheezing. ?Denies chest pains, palpitations and leg swelling ?Denies abdominal pain, nausea, vomiting,diarrhea or constipation.   ?Denies dysuria, frequency, hesitancy or incontinence. ?Denies joint pain, swelling and limitation in mobility. ?Denies headaches, seizures, numbness, or tingling. ?Denies depression, anxiety or insomnia. ?Denies skin break down or rash. ? ? ?PE ? ?BP 138/88   Pulse 70   Ht '5\' 10"'$  (1.778 m)   Wt 213 lb 1.3 oz (96.7 kg)   SpO2 97%   BMI 30.57 kg/m?  ? ?Patient alert and oriented and in no cardiopulmonary distress. ? ?HEENT: No facial asymmetry, EOMI,     Neck supple . ? ?Chest: Clear to auscultation bilaterally. ? ?CVS: S1, S2 no murmurs, no S3.Regular rate. ? ?ABD: Soft non tender.  ? ?Ext: No edema ? ?MS: Adequate ROM spine, shoulders, hips and knees. ? ?Skin: Intact, no ulcerations or rash noted. ? ?Psych: Good eye contact, normal affect. Memory intact not anxious or depressed appearing. ? ?CNS: CN 2-12 intact, power,  normal throughout.no focal deficits noted. ? ? ?Assessment & Plan ? ?Essential hypertension ?DASH diet and commitment to daily physical activity for a minimum of 30 minutes discussed and encouraged, as a part of hypertension management. ?The importance of  attaining a healthy weight is also discussed. ?Controlled, no change in medication ? ? ? ?  10/09/2021  ?  3:00 PM 07/30/2020  ?  2:01 PM 07/23/2020  ?  8:56 AM 07/02/2020  ?  3:28 PM 04/24/2020  ?  2:19 PM 04/24/2020  ?  1:35 PM 03/05/2020  ?  9:43 AM  ?BP/Weight  ?Systolic BP 263  335 456 256 139 142  ?Diastolic BP 88  80 81 90 84 88  ?Wt. (Lbs) 213.08 206 219 219  211.08 212  ?BMI 30.57 kg/m2 29.56 kg/m2 31.42 kg/m2 31.42 kg/m2  30.29 kg/m2 30.42 kg/m2  ? ? ? ? ? ?Hyperlipidemia with target LDL less than 100 ?Hyperlipidemia:Low fat diet discussed and encouraged. ? ? ?Lipid Panel  ?Lab Results  ?Component Value Date  ? CHOL 155 10/09/2021  ? HDL 37 (L) 10/09/2021  ? [place] 84 10/09/2021  ? LDLDIRECT 86 06/01/2014  ? TRIG 198 (H) 10/09/2021  ? CHOLHDL 4.2 10/09/2021  ? ? ? ?Improved, bu still not at goal. ? ? ?Obesity (BMI 30.0-34.9) ? ?Patient re-educated about  the importance of commitment to a  minimum of 150 minutes of exercise per week as able. ? ?The importance of healthy food choices with portion control discussed, as well as eating regularly and within a 12 hour window most days. ?The need to choose "clean , green" food 50 to 75% of the time is discussed, as well as to make water the primary drink and set a goal of 64 ounces water daily. ? ?  ? ?  10/09/2021  ?  3:00 PM 07/30/2020  ?  2:01 PM 07/23/2020  ?  8:56 AM  ?Weight /BMI  ?Weight 213 lb 1.3 oz 206 lb 219 lb  ?Height '5\' 10"'$  (1.778 m) '5\' 10"'$  (1.778 m) '5\' 10"'$  (1.778 m)  ?BMI 30.57 kg/m2 29.56 kg/m2 31.42 kg/m2  ? ? ?Start semaglutide weekly ? ?Prediabetes ?Patient educated about the importance of limiting  Carbohydrate intake , the need to commit to daily physical activity for a minimum of 30 minutes , and to commit weight loss. ?The fact that changes in all these areas will reduce or eliminate all together the development of diabetes is stressed.  ? ? ?  Latest Ref Rng & Units 10/09/2021  ?  4:19 PM 04/24/2020  ?  3:21 PM 04/24/2020  ?  2:45 PM 12/20/2019  ?   4:36 PM 11/24/2018  ?  2:14 PM  ?Diabetic Labs  ?HbA1c 4.8 - 5.6 % 6.0   6.1    6.1   6.0    ?Chol 100 - 199 mg/dL 155     171   139    ?HDL >39 mg/dL 37     35   30    ?Calc LDL 0 - 99 mg/dL 84     104   80    ?Triglycerides 0 - 149 mg/dL 198     184   193    ?Creatinine 0.76 - 1.27 mg/dL 0.79    0.91   1.05   0.93    ? ? ?  10/09/2021  ?  3:00 PM 07/30/2020  ?  2:01 PM 07/23/2020  ?  8:56 AM 07/02/2020  ?  3:28 PM 04/24/2020  ?  2:19 PM 04/24/2020  ?  1:35 PM 03/05/2020  ?  9:43 AM  ?BP/Weight  ?Systolic BP 696  789 381 017 139 142  ?Diastolic BP 88  80 81 90 84 88  ?Wt. (Lbs) 213.08 206 219 219  211.08 212  ?BMI 30.57 kg/m2 29.56 kg/m2 31.42 kg/m2 31.42 kg/m2  30.29 kg/m2 30.42 kg/m2  ? ?   ? View : No data to display.  ?  ?  ?  ? ? ? ? ?

## 2021-10-13 NOTE — Assessment & Plan Note (Signed)
DASH diet and commitment to daily physical activity for a minimum of 30 minutes discussed and encouraged, as a part of hypertension management. ?The importance of attaining a healthy weight is also discussed. ?Controlled, no change in medication ? ? ? ?  10/09/2021  ?  3:00 PM 07/30/2020  ?  2:01 PM 07/23/2020  ?  8:56 AM 07/02/2020  ?  3:28 PM 04/24/2020  ?  2:19 PM 04/24/2020  ?  1:35 PM 03/05/2020  ?  9:43 AM  ?BP/Weight  ?Systolic BP 574  935 521 747 139 142  ?Diastolic BP 88  80 81 90 84 88  ?Wt. (Lbs) 213.08 206 219 219  211.08 212  ?BMI 30.57 kg/m2 29.56 kg/m2 31.42 kg/m2 31.42 kg/m2  30.29 kg/m2 30.42 kg/m2  ? ? ? ? ?

## 2021-10-13 NOTE — Assessment & Plan Note (Signed)
Patient educated about the importance of limiting  Carbohydrate intake , the need to commit to daily physical activity for a minimum of 30 minutes , and to commit weight loss. ?The fact that changes in all these areas will reduce or eliminate all together the development of diabetes is stressed.  ? ? ?  Latest Ref Rng & Units 10/09/2021  ?  4:19 PM 04/24/2020  ?  3:21 PM 04/24/2020  ?  2:45 PM 12/20/2019  ?  4:36 PM 11/24/2018  ?  2:14 PM  ?Diabetic Labs  ?HbA1c 4.8 - 5.6 % 6.0   6.1    6.1   6.0    ?Chol 100 - 199 mg/dL 155     171   139    ?HDL >39 mg/dL 37     35   30    ?Calc LDL 0 - 99 mg/dL 84     104   80    ?Triglycerides 0 - 149 mg/dL 198     184   193    ?Creatinine 0.76 - 1.27 mg/dL 0.79    0.91   1.05   0.93    ? ? ?  10/09/2021  ?  3:00 PM 07/30/2020  ?  2:01 PM 07/23/2020  ?  8:56 AM 07/02/2020  ?  3:28 PM 04/24/2020  ?  2:19 PM 04/24/2020  ?  1:35 PM 03/05/2020  ?  9:43 AM  ?BP/Weight  ?Systolic BP 697  948 016 553 139 142  ?Diastolic BP 88  80 81 90 84 88  ?Wt. (Lbs) 213.08 206 219 219  211.08 212  ?BMI 30.57 kg/m2 29.56 kg/m2 31.42 kg/m2 31.42 kg/m2  30.29 kg/m2 30.42 kg/m2  ? ?   ? View : No data to display.  ?  ?  ?  ? ? ? ?

## 2021-10-13 NOTE — Assessment & Plan Note (Addendum)
Hyperlipidemia:Low fat diet discussed and encouraged. ? ? ?Lipid Panel  ?Lab Results  ?Component Value Date  ? CHOL 155 10/09/2021  ? HDL 37 (L) 10/09/2021  ? [place] 84 10/09/2021  ? LDLDIRECT 86 06/01/2014  ? TRIG 198 (H) 10/09/2021  ? CHOLHDL 4.2 10/09/2021  ? ? ? ?Improved, bu still not at goal. ? ?

## 2021-10-13 NOTE — Assessment & Plan Note (Signed)
?  Patient re-educated about  the importance of commitment to a  minimum of 150 minutes of exercise per week as able. ? ?The importance of healthy food choices with portion control discussed, as well as eating regularly and within a 12 hour window most days. ?The need to choose "clean , green" food 50 to 75% of the time is discussed, as well as to make water the primary drink and set a goal of 64 ounces water daily. ? ?  ? ?  10/09/2021  ?  3:00 PM 07/30/2020  ?  2:01 PM 07/23/2020  ?  8:56 AM  ?Weight /BMI  ?Weight 213 lb 1.3 oz 206 lb 219 lb  ?Height '5\' 10"'$  (1.778 m) '5\' 10"'$  (1.778 m) '5\' 10"'$  (1.778 m)  ?BMI 30.57 kg/m2 29.56 kg/m2 31.42 kg/m2  ? ? ?Start semaglutide weekly ?

## 2021-10-18 ENCOUNTER — Other Ambulatory Visit: Payer: Self-pay | Admitting: Family Medicine

## 2021-11-26 ENCOUNTER — Encounter (INDEPENDENT_AMBULATORY_CARE_PROVIDER_SITE_OTHER): Payer: Self-pay | Admitting: *Deleted

## 2021-11-28 ENCOUNTER — Other Ambulatory Visit: Payer: Self-pay | Admitting: Family Medicine

## 2021-11-29 ENCOUNTER — Encounter: Payer: Self-pay | Admitting: Family Medicine

## 2021-11-29 ENCOUNTER — Ambulatory Visit (INDEPENDENT_AMBULATORY_CARE_PROVIDER_SITE_OTHER): Payer: BC Managed Care – PPO | Admitting: Family Medicine

## 2021-11-29 VITALS — BP 130/80 | HR 69 | Ht 70.0 in | Wt 215.0 lb

## 2021-11-29 DIAGNOSIS — Z0000 Encounter for general adult medical examination without abnormal findings: Secondary | ICD-10-CM | POA: Diagnosis not present

## 2021-11-29 DIAGNOSIS — R7303 Prediabetes: Secondary | ICD-10-CM | POA: Diagnosis not present

## 2021-11-29 DIAGNOSIS — I10 Essential (primary) hypertension: Secondary | ICD-10-CM | POA: Diagnosis not present

## 2021-11-29 DIAGNOSIS — E785 Hyperlipidemia, unspecified: Secondary | ICD-10-CM

## 2021-11-29 MED ORDER — SPIRONOLACTONE 50 MG PO TABS: 50.0000 mg | ORAL_TABLET | Freq: Every day | ORAL | 3 refills | Status: DC

## 2021-11-29 MED ORDER — IBUPROFEN 800 MG PO TABS: 800.0000 mg | ORAL_TABLET | Freq: Three times a day (TID) | ORAL | 1 refills | Status: DC | PRN

## 2021-11-29 MED ORDER — ATORVASTATIN CALCIUM 10 MG PO TABS: 10.0000 mg | ORAL_TABLET | Freq: Every day | ORAL | 3 refills | Status: DC

## 2021-11-29 MED ORDER — AMLODIPINE BESYLATE 10 MG PO TABS: 10.0000 mg | ORAL_TABLET | Freq: Every day | ORAL | 3 refills | Status: DC

## 2022-01-14 ENCOUNTER — Encounter: Payer: Self-pay | Admitting: Emergency Medicine

## 2022-01-14 ENCOUNTER — Ambulatory Visit
Admission: EM | Admit: 2022-01-14 | Discharge: 2022-01-14 | Disposition: A | Discharge status: Home / Self Care | Payer: BC Managed Care – PPO | Attending: Nurse Practitioner | Admitting: Nurse Practitioner

## 2022-01-14 DIAGNOSIS — J069 Acute upper respiratory infection, unspecified: Secondary | ICD-10-CM | POA: Diagnosis not present

## 2022-01-14 DIAGNOSIS — U071 COVID-19: Secondary | ICD-10-CM | POA: Diagnosis not present

## 2022-01-14 LAB — RESP PANEL BY RT-PCR (FLU A&B, COVID) ARPGX2
Influenza A by PCR: NEGATIVE
Influenza B by PCR: NEGATIVE
SARS Coronavirus 2 by RT PCR: POSITIVE — AB

## 2022-01-14 MED ORDER — BENZONATATE 100 MG PO CAPS: 100.0000 mg | ORAL_CAPSULE | Freq: Three times a day (TID) | ORAL | 0 refills | Status: DC | PRN

## 2022-01-14 MED ORDER — PROMETHAZINE-DM 6.25-15 MG/5ML PO SYRP: 5.0000 mL | ORAL_SOLUTION | Freq: Every evening | ORAL | 0 refills | Status: DC | PRN

## 2022-01-14 NOTE — Discharge Instructions (Addendum)
Your symptoms and exam findings are most consistent with a viral upper respiratory infection. These usually run their course in about 10 days.  If your symptoms last longer than 10 days without improvement, please follow up with your primary care provider.  If your symptoms, worsen, please go to the Emergency Room.    We have tested you today for COVID-19 and influenza.  You will see the results in Mychart and we will call you with positive results.    Please stay home and isolate until you are aware of the results.    Some things that can make you feel better are: - Increased rest - Increasing fluid with water/sugar free electrolytes - Acetaminophen and ibuprofen as needed for fever/pain.  - Salt water gargling, chloraseptic spray and throat lozenges - OTC guaifenesin (Mucinex).  - Saline sinus flushes or a neti pot.  - Humidifying the air. - Cough suppressants as needed for dry cough.

## 2022-01-14 NOTE — ED Provider Notes (Signed)
[facility]    CSN: [id number] Arrival date & time: 01/14/22  1203      History   Chief Complaint Chief Complaint  Patient presents with   Cough    HPI Evan Black is a 51 y.o. male.   Patient presents with 1 day of cough, congestion, body aches, decreased appetite, diarrhea, runny nose, nasal congestion, headache, sinus pressure, and fatigue.  He denies chest pain, chest tightness, sore throat, ear pain or pressure, ear drainage, eye redness or drainage, abdominal pain, nausea/vomiting.  Denies blood in his stool.  Has not taken anything for symptoms so far.  He is requesting COVID-19 testing today.    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   Lab test positive for detection of COVID-19 virus 07/29/2020   COVID-19 virus RNA test result positive at limit of detection 07/24/2020   Sleep disorder breathing 09/26/2015   Annual physical exam 38/25/0539   Metabolic syndrome X 76/73/4193   Back pain 03/18/2012   Hyperlipidemia with target LDL less than 100 03/14/2012   Prediabetes 03/14/2012   Allergic rhinitis 03/02/2010   Obesity (BMI 30.0-34.9) 02/27/2010   Essential hypertension 07/18/2008    Past Surgical History:  Procedure Laterality Date   BACK SURGERY         Home Medications    Prior to Admission medications   Medication Sig Start Date End Date Taking? Authorizing Provider  amLODipine (NORVASC) 10 MG tablet Take 1 tablet (10 mg total) by mouth daily. 11/29/21  Yes Fayrene Helper, MD  atorvastatin (LIPITOR) 10 MG tablet Take 1 tablet (10 mg total) by mouth daily. 11/29/21  Yes Fayrene Helper, MD  azelastine (ASTELIN) 0.1 % nasal spray Place 2 sprays into both nostrils 2 (two) times daily. Use in each nostril as directed 10/09/21  Yes Fayrene Helper, MD  benzonatate (TESSALON) 100 MG capsule Take  1 capsule (100 mg total) by mouth 3 (three) times daily as needed for cough. Do not take with alcohol or while driving or operating heavy machinery 01/14/22  Yes Eulogio Bear, NP  clotrimazole-betamethasone (LOTRISONE) cream APPLY TO AFFECTED AREAS TWICE DAILY. 09/16/21  Yes Fayrene Helper, MD  cyclobenzaprine (FLEXERIL) 10 MG tablet TAKE 1 TABLET BY MOUTH ONCE DAILY AS NEEDED. 07/22/21  Yes Fayrene Helper, MD  ibuprofen (ADVIL) 800 MG tablet Take 1 tablet (800 mg total) by mouth every 8 (eight) hours as needed. 11/29/21  Yes Fayrene Helper, MD  promethazine-dextromethorphan (PROMETHAZINE-DM) 6.25-15 MG/5ML syrup Take 5 mLs by mouth at bedtime as needed for cough. Do not take with alcohol or while driving or operating heavy machinery 01/14/22  Yes Noemi Chapel A, NP  spironolactone (ALDACTONE) 50 MG tablet Take 1 tablet (50 mg total) by mouth daily. 11/29/21  Yes Fayrene Helper, MD  phentermine (ADIPEX-P) 37.5 MG tablet Take one half tablet every morning with breakfast, by mouth 03/30/19 07/26/19  Fayrene Helper, MD    Family History Family History  Problem Relation Age of Onset   Hypertension Mother     Social History Social History   Tobacco Use   Smoking status: Former    Packs/day: 0.25    Years: 40.00    Total pack years: 10.00    Types: Cigarettes   Smokeless tobacco: Never   Tobacco comments:    smokes 2-3  cigarettes per day 03/05/2020  Vaping Use   Vaping Use: Never used  Substance Use Topics   Alcohol use: Yes    Alcohol/week: 18.0 standard drinks of alcohol    Types: 18 Cans of beer per week   Drug use: No     Allergies   Patient has no known allergies.   Review of Systems Review of Systems Per HPI  Physical Exam Triage Vital Signs ED Triage Vitals  Enc Vitals Group     BP 01/14/22 1218 134/88     Pulse Rate 01/14/22 1218 63     Resp 01/14/22 1218 18     Temp 01/14/22 1218 98.5 F (36.9 C)     Temp Source 01/14/22 1218 Oral      SpO2 01/14/22 1218 94 %     Weight 01/14/22 1220 216 lb (98 kg)     Height 01/14/22 1220 '5\' 10"'$  (1.778 m)     Head Circumference --      Peak Flow --      Pain Score 01/14/22 1220 0     Pain Loc --      Pain Edu? --      Excl. in [place]? --    No data found.  Updated Vital Signs BP 134/88 (BP Location: Right Arm)   Pulse 63   Temp 98.5 F (36.9 C) (Oral)   Resp 18   Ht '5\' 10"'$  (1.778 m)   Wt 216 lb (98 kg)   SpO2 94%   BMI 30.99 kg/m   Visual Acuity Right Eye Distance:   Left Eye Distance:   Bilateral Distance:    Right Eye Near:   Left Eye Near:    Bilateral Near:     Physical Exam Vitals and nursing note reviewed.  Constitutional:      General: He is not in acute distress.    Appearance: Normal appearance. He is not ill-appearing or toxic-appearing.  HENT:     Head: Normocephalic and atraumatic.     Right Ear: Tympanic membrane, ear canal and external ear normal.     Left Ear: Tympanic membrane, ear canal and external ear normal.     Nose: Congestion and rhinorrhea present.     Mouth/Throat:     Mouth: Mucous membranes are moist.     Pharynx: Oropharynx is clear. Posterior oropharyngeal erythema present. No oropharyngeal exudate.     Tonsils: 0 on the right. 0 on the left.  Eyes:     General: No scleral icterus.    Extraocular Movements: Extraocular movements intact.  Cardiovascular:     Rate and Rhythm: Normal rate and regular rhythm.  Pulmonary:     Effort: Pulmonary effort is normal. No respiratory distress.     Breath sounds: Normal breath sounds. No wheezing, rhonchi or rales.  Abdominal:     General: There is no distension.     Palpations: Abdomen is soft.  Musculoskeletal:     Cervical back: Normal range of motion and neck supple.  Lymphadenopathy:     Cervical: Cervical adenopathy present.  Skin:    General: Skin is warm and dry.     Coloration: Skin is not jaundiced or pale.     Findings: No erythema or rash.  Neurological:     Mental Status:  He is alert and oriented to person, place, and time.  Psychiatric:        Behavior: Behavior is cooperative.      UC Treatments / Results  Labs (all labs  ordered are listed, but only abnormal results are displayed) Labs Reviewed  RESP PANEL BY RT-PCR (FLU A&B, COVID) [id number]    EKG   Radiology No results found.  Procedures Procedures (including critical care time)  Medications Ordered in UC Medications - No data to display  Initial Impression / Assessment and Plan / UC Course  I have reviewed the triage vital signs and the nursing notes.  Pertinent labs & imaging results that were available during my care of the patient were reviewed by me and considered in my medical decision making (see chart for details).    Patient is a very pleasant, well-appearing 51 year old male presenting for cough, congestion, and diarrhea today.  I am highly suspicious for COVID-19, we will obtain testing today.  He is a good candidate for antibiotic therapy if he is positive.  Supportive care discussed.  Start cough suppressants.  Note given for work.  Seek care if symptoms persist more than 10 days without improvement.  If symptoms worsen or with any chest pain, shortness of breath, go to ER.  The patient was given the opportunity to ask questions.  All questions answered to their satisfaction.  The patient is in agreement to this plan.   Final Clinical Impressions(s) / UC Diagnoses   Final diagnoses:  Viral URI with cough     Discharge Instructions      Your symptoms and exam findings are most consistent with a viral upper respiratory infection. These usually run their course in about 10 days.  If your symptoms last longer than 10 days without improvement, please follow up with your primary care provider.  If your symptoms, worsen, please go to the Emergency Room.    We have tested you today for COVID-19 and influenza.  You will see the results in Mychart and we will call you with positive  results.    Please stay home and isolate until you are aware of the results.    Some things that can make you feel better are: - Increased rest - Increasing fluid with water/sugar free electrolytes - Acetaminophen and ibuprofen as needed for fever/pain.  - Salt water gargling, chloraseptic spray and throat lozenges - OTC guaifenesin (Mucinex).  - Saline sinus flushes or a neti pot.  - Humidifying the air. - Cough suppressants as needed for dry cough.    ED Prescriptions     Medication Sig Dispense Auth. Provider   benzonatate (TESSALON) 100 MG capsule Take 1 capsule (100 mg total) by mouth 3 (three) times daily as needed for cough. Do not take with alcohol or while driving or operating heavy machinery 21 capsule Noemi Chapel A, NP   promethazine-dextromethorphan (PROMETHAZINE-DM) 6.25-15 MG/5ML syrup Take 5 mLs by mouth at bedtime as needed for cough. Do not take with alcohol or while driving or operating heavy machinery 118 mL Eulogio Bear, NP      PDMP not reviewed this encounter.   Eulogio Bear, NP 01/14/22 1322

## 2022-01-14 NOTE — ED Triage Notes (Signed)
Patient requesting a COVID test, c/o cough, congestion, diarrhea x 1 day.  Patient denies any OTC meds.

## 2022-01-15 ENCOUNTER — Telehealth (HOSPITAL_COMMUNITY): Payer: Self-pay | Admitting: Emergency Medicine

## 2022-01-15 MED ORDER — NIRMATRELVIR/RITONAVIR (PAXLOVID)TABLET: 3.0000 | ORAL_TABLET | Freq: Two times a day (BID) | ORAL | 0 refills | Status: AC

## 2022-01-21 ENCOUNTER — Telehealth: Payer: BC Managed Care – PPO | Admitting: Family Medicine

## 2022-01-22 ENCOUNTER — Ambulatory Visit (INDEPENDENT_AMBULATORY_CARE_PROVIDER_SITE_OTHER): Payer: BC Managed Care – PPO | Admitting: Family Medicine

## 2022-01-22 ENCOUNTER — Encounter: Payer: Self-pay | Admitting: Family Medicine

## 2022-01-22 DIAGNOSIS — J029 Acute pharyngitis, unspecified: Secondary | ICD-10-CM

## 2022-01-22 DIAGNOSIS — R509 Fever, unspecified: Secondary | ICD-10-CM

## 2022-01-22 DIAGNOSIS — G4489 Other headache syndrome: Secondary | ICD-10-CM

## 2022-01-22 MED ORDER — AZITHROMYCIN 250 MG PO TABS: ORAL_TABLET | ORAL | 0 refills | Status: AC

## 2022-01-22 NOTE — Patient Instructions (Addendum)
Come tomorrow aT 3:30 pm for throat swab for influenza and strep  I am prescribing Azithromycin for c/o fever, sore throat and headache   Use tylenol , ibuprofen and tessalin perles for cough and body aches and fever  Drink a lot of fluids   Thanks for choosing [facility], we consider it a privelige to serve you.  Get a lot of rest

## 2022-01-22 NOTE — Progress Notes (Signed)
Virtual Visit via telephone  Note  I connected with Evan Black on 01/22/22 at  4:40 PM EDT by  telemedicine application and verified that I am speaking with the correct person using two identifiers.  Location: Patient: work Provider:office   I discussed the limitations of evaluation and management by telemedicine and the availability of in person appointments. The patient expressed understanding and agreed to proceed.  History of Present Illness: Dx wih covid 01/14/2022, states he till continues to feel weak and debilitated 1 week pos dx, c/o sore throat, thick discolored mucus, headache and cough , also states he had a fever earlier today at work. No known contact with influeza, strep throat wife now has covid   Observations/Objective: There were no vitals taken for this visit. Good communication with no confusion and intact memory. Alert and oriented x 3 No signs of respiratory distress , positive head congestion and cough present during interview   Assessment and Plan: Sore throat Check strep throat and test for influenza Z pack prescribed  Fever in adult Per pt history, sounds viral, will come for flu and strep testing in am, advised tylenol/ NSAID , with fluids and rest  Headache Related to acute illness will screen  For infectious disease, no neurologic signs reported   Follow Up Instructions:    I discussed the assessment and treatment plan with the patient. The patient was provided an opportunity to ask questions and all were answered. The patient agreed with the plan and demonstrated an understanding of the instructions.   The patient was advised to call back or seek an in-person evaluation if the symptoms worsen or if the condition fails to improve as anticipated.  I provided 12 minutes of non-face-to-face time during this encounter.   Tula Nakayama, MD

## 2022-01-22 NOTE — Telephone Encounter (Signed)
Patient made a virtual 01-22-22

## 2022-01-23 ENCOUNTER — Telehealth: Payer: Self-pay

## 2022-01-23 ENCOUNTER — Ambulatory Visit (INDEPENDENT_AMBULATORY_CARE_PROVIDER_SITE_OTHER): Payer: BC Managed Care – PPO

## 2022-01-23 DIAGNOSIS — J029 Acute pharyngitis, unspecified: Secondary | ICD-10-CM | POA: Diagnosis not present

## 2022-01-23 LAB — POCT INFLUENZA A/B
Influenza A, POC: NEGATIVE
Influenza B, POC: NEGATIVE

## 2022-01-23 LAB — POCT RAPID STREP A (OFFICE): Rapid Strep A Screen: NEGATIVE

## 2022-01-23 NOTE — Telephone Encounter (Signed)
Letter typed and up front to collect

## 2022-01-23 NOTE — Telephone Encounter (Signed)
Patient wife called said patient needs a letter says a note saying he has a appointment today for throat swab before he can leave his work to get this done.  Wife will pick it up.

## 2022-01-24 ENCOUNTER — Encounter: Payer: Self-pay | Admitting: Family Medicine

## 2022-01-24 NOTE — Telephone Encounter (Signed)
Letter placed at front desk for pick up patient aware.

## 2022-01-27 ENCOUNTER — Encounter: Payer: Self-pay | Admitting: Family Medicine

## 2022-01-27 DIAGNOSIS — R519 Headache, unspecified: Secondary | ICD-10-CM | POA: Insufficient documentation

## 2022-01-27 DIAGNOSIS — R509 Fever, unspecified: Secondary | ICD-10-CM | POA: Insufficient documentation

## 2022-01-27 DIAGNOSIS — J029 Acute pharyngitis, unspecified: Secondary | ICD-10-CM | POA: Insufficient documentation

## 2022-01-27 HISTORY — DX: Headache, unspecified: R51.9

## 2022-01-27 NOTE — Assessment & Plan Note (Signed)
Per pt history, sounds viral, will come for flu and strep testing in am, advised tylenol/ NSAID , with fluids and rest

## 2022-01-27 NOTE — Assessment & Plan Note (Signed)
Related to acute illness will screen  For infectious disease, no neurologic signs reported

## 2022-01-27 NOTE — Assessment & Plan Note (Signed)
Check strep throat and test for influenza Z pack prescribed

## 2022-03-10 ENCOUNTER — Other Ambulatory Visit (INDEPENDENT_AMBULATORY_CARE_PROVIDER_SITE_OTHER): Payer: Self-pay

## 2022-03-10 DIAGNOSIS — I10 Essential (primary) hypertension: Secondary | ICD-10-CM

## 2022-03-10 DIAGNOSIS — Z1211 Encounter for screening for malignant neoplasm of colon: Secondary | ICD-10-CM

## 2022-04-08 ENCOUNTER — Telehealth (INDEPENDENT_AMBULATORY_CARE_PROVIDER_SITE_OTHER): Payer: Self-pay

## 2022-04-08 ENCOUNTER — Encounter (INDEPENDENT_AMBULATORY_CARE_PROVIDER_SITE_OTHER): Payer: Self-pay

## 2022-04-08 NOTE — Telephone Encounter (Signed)
Referring MD/PCP: Dr Tula Nakayama  Procedure: Tcs  Reason/Indication:  Screening   Has patient had this procedure before?  No   If so, when, by whom and where?    Is there a family history of colon cancer?  No   Who?  What age when diagnosed?    Is patient diabetic? If yes, Type 1 or Type 2   No      Does patient have prosthetic heart valve or mechanical valve?  No   Do you have a pacemaker/defibrillator?  No   Has patient ever had endocarditis/atrial fibrillation? No   Does patient use oxygen? No   Has patient had joint replacement within last 12 months?  No   Is patient constipated or do they take laxatives? No   Does patient have a history of alcohol/drug use?  No  Have you had a stroke/heart attack last 6 mths? No   Do you take medicine for weight loss?  No   For male patients,: have you had a hysterectomy n/a                      are you post menopausal n/a                      do you still have your menstrual cycle n/a  Is patient on blood thinner such as Coumadin, Plavix and/or Aspirin? No   Medications: amlodipine 10 mg daily, atorvastatin 10 mg daily, spironolactone 50 mg daily, cyclobenzaprine 10 mg daily, Iburprofen 800 mg prn   Allergies: NKDA  Medication Adjustment per Dr Jenetta Downer None  Procedure date & time: 06/04/22 at 830

## 2022-04-08 NOTE — Telephone Encounter (Signed)
Ashley Montminy Ann Roddie Riegler, CMA  ?

## 2022-04-10 ENCOUNTER — Telehealth: Payer: Self-pay | Admitting: *Deleted

## 2022-04-10 NOTE — Telephone Encounter (Signed)
Pt wife called in. Patient procedure needed to be rescheduled. He has been moved to 12/1 at 12:30pm. He already has his prep. I will mail new instructions. Confirmed address.

## 2022-04-23 ENCOUNTER — Encounter: Payer: Self-pay | Admitting: *Deleted

## 2022-04-23 MED ORDER — PEG 3350-KCL-NA BICARB-NACL 420 G PO SOLR: 4000.0000 mL | Freq: Once | ORAL | 0 refills | Status: AC

## 2022-05-09 ENCOUNTER — Encounter (HOSPITAL_COMMUNITY): Payer: Self-pay | Admitting: Gastroenterology

## 2022-05-09 ENCOUNTER — Other Ambulatory Visit: Payer: Self-pay

## 2022-05-09 ENCOUNTER — Ambulatory Visit (HOSPITAL_COMMUNITY): Payer: BC Managed Care – PPO | Admitting: Anesthesiology

## 2022-05-09 ENCOUNTER — Encounter (HOSPITAL_COMMUNITY)
Admission: RE | Disposition: A | Discharge status: Home / Self Care | Payer: Self-pay | Source: Home / Self Care | Attending: Gastroenterology

## 2022-05-09 ENCOUNTER — Ambulatory Visit (HOSPITAL_COMMUNITY)
Admission: RE | Admit: 2022-05-09 | Discharge: 2022-05-09 | Disposition: A | Discharge status: Home / Self Care | Payer: BC Managed Care – PPO | Attending: Gastroenterology | Admitting: Gastroenterology

## 2022-05-09 DIAGNOSIS — D120 Benign neoplasm of cecum: Secondary | ICD-10-CM | POA: Diagnosis not present

## 2022-05-09 DIAGNOSIS — Z87891 Personal history of nicotine dependence: Secondary | ICD-10-CM | POA: Insufficient documentation

## 2022-05-09 DIAGNOSIS — Z1211 Encounter for screening for malignant neoplasm of colon: Secondary | ICD-10-CM | POA: Insufficient documentation

## 2022-05-09 DIAGNOSIS — D123 Benign neoplasm of transverse colon: Secondary | ICD-10-CM | POA: Insufficient documentation

## 2022-05-09 DIAGNOSIS — K5730 Diverticulosis of large intestine without perforation or abscess without bleeding: Secondary | ICD-10-CM | POA: Insufficient documentation

## 2022-05-09 DIAGNOSIS — I10 Essential (primary) hypertension: Secondary | ICD-10-CM | POA: Diagnosis not present

## 2022-05-09 DIAGNOSIS — R519 Headache, unspecified: Secondary | ICD-10-CM | POA: Insufficient documentation

## 2022-05-09 DIAGNOSIS — K635 Polyp of colon: Secondary | ICD-10-CM | POA: Diagnosis not present

## 2022-05-09 DIAGNOSIS — D122 Benign neoplasm of ascending colon: Secondary | ICD-10-CM | POA: Insufficient documentation

## 2022-05-09 HISTORY — PX: COLONOSCOPY WITH PROPOFOL: SHX5780

## 2022-05-09 HISTORY — PX: POLYPECTOMY: SHX149

## 2022-05-09 LAB — CMP14+EGFR
ALT: 44 IU/L (ref 0–44)
AST: 36 IU/L (ref 0–40)
Albumin/Globulin Ratio: 1.8 (ref 1.2–2.2)
Albumin: 4.4 g/dL (ref 3.8–4.9)
Alkaline Phosphatase: 64 IU/L (ref 44–121)
BUN/Creatinine Ratio: 14 (ref 9–20)
BUN: 14 mg/dL (ref 6–24)
Bilirubin Total: 0.7 mg/dL (ref 0.0–1.2)
CO2: 22 mmol/L (ref 20–29)
Calcium: 9.5 mg/dL (ref 8.7–10.2)
Chloride: 104 mmol/L (ref 96–106)
Creatinine, Ser: 1.01 mg/dL (ref 0.76–1.27)
Globulin, Total: 2.5 g/dL (ref 1.5–4.5)
Glucose: 99 mg/dL (ref 70–99)
Potassium: 4.3 mmol/L (ref 3.5–5.2)
Sodium: 141 mmol/L (ref 134–144)
Total Protein: 6.9 g/dL (ref 6.0–8.5)
eGFR: 89 mL/min/{1.73_m2} (ref >59)

## 2022-05-09 LAB — LIPID PANEL
Chol/HDL Ratio: 5.3 ratio — ABNORMAL HIGH (ref 0.0–5.0)
Cholesterol, Total: 168 mg/dL (ref 100–199)
HDL: 32 mg/dL — ABNORMAL LOW (ref >39)
LDL Chol Calc (NIH): 104 mg/dL — ABNORMAL HIGH (ref 0–99)
Triglycerides: 184 mg/dL — ABNORMAL HIGH (ref 0–149)
VLDL Cholesterol Cal: 32 mg/dL (ref 5–40)

## 2022-05-09 LAB — HEMOGLOBIN A1C
Est. average glucose Bld gHb Est-mCnc: 131 mg/dL
Hgb A1c MFr Bld: 6.2 % — ABNORMAL HIGH (ref 4.8–5.6)

## 2022-05-09 LAB — HM COLONOSCOPY

## 2022-05-09 SURGERY — COLONOSCOPY WITH PROPOFOL
Anesthesia: General

## 2022-05-09 MED ORDER — LIDOCAINE HCL (CARDIAC) PF 100 MG/5ML IV SOSY
PREFILLED_SYRINGE | INTRAVENOUS | Status: DC | PRN
  Administered 2022-05-09: 50 mg via INTRATRACHEAL

## 2022-05-09 MED ORDER — PROPOFOL 10 MG/ML IV BOLUS
INTRAVENOUS | Status: DC | PRN
  Administered 2022-05-09: 100 mg via INTRAVENOUS

## 2022-05-09 MED ORDER — PROPOFOL 500 MG/50ML IV EMUL
INTRAVENOUS | Status: DC | PRN
  Administered 2022-05-09: 200 ug/kg/min via INTRAVENOUS

## 2022-05-09 MED ORDER — LACTATED RINGERS IV SOLN
INTRAVENOUS | Status: DC
  Administered 2022-05-09: 1000 mL via INTRAVENOUS

## 2022-05-09 NOTE — Discharge Instructions (Signed)
You are being discharged to home.  Resume your previous diet.  We are waiting for your pathology results.  Your physician has recommended a repeat colonoscopy for surveillance based on pathology results.  

## 2022-05-09 NOTE — Anesthesia Preprocedure Evaluation (Addendum)
Anesthesia Evaluation  Patient identified by MRN, date of birth, ID band Patient awake    Reviewed: Allergy & Precautions, H&P , NPO status , Patient's Chart, lab work & pertinent test results  Airway Mallampati: II  TM Distance: >3 FB Neck ROM: Full    Dental no notable dental hx. (+) Dental Advisory Given, Teeth Intact   Pulmonary former smoker   Pulmonary exam normal breath sounds clear to auscultation       Cardiovascular Exercise Tolerance: Good hypertension, Pt. on medications Normal cardiovascular exam Rhythm:Regular Rate:Normal     Neuro/Psych  Headaches  negative psych ROS   GI/Hepatic negative GI ROS,,,(+)     substance abuse  marijuana use  Endo/Other  negative endocrine ROS    Renal/GU negative Renal ROS  negative genitourinary   Musculoskeletal negative musculoskeletal ROS (+)    Abdominal   Peds negative pediatric ROS (+)  Hematology negative hematology ROS (+)   Anesthesia Other Findings   Reproductive/Obstetrics negative OB ROS                             Anesthesia Physical Anesthesia Plan  ASA: 2  Anesthesia Plan: General   Post-op Pain Management:    Induction: Intravenous  PONV Risk Score and Plan: 1 and Propofol infusion  Airway Management Planned: Nasal Cannula and Natural Airway  Additional Equipment:   Intra-op Plan:   Post-operative Plan:   Informed Consent: I have reviewed the patients History and Physical, chart, labs and discussed the procedure including the risks, benefits and alternatives for the proposed anesthesia with the patient or authorized representative who has indicated his/her understanding and acceptance.     Dental advisory given  Plan Discussed with: CRNA and Surgeon  Anesthesia Plan Comments:        Anesthesia Quick Evaluation

## 2022-05-09 NOTE — H&P (Signed)
Evan Black is an 51 y.o. male.   Chief Complaint: CRC screening HPI: 51 y/o M with PMH HTN, coming for screening colonoscopy. The patient has never had a colonoscopy in the past.  The patient denies having any complaints such as melena, hematochezia, abdominal pain or distention, change in her bowel movement consistency or frequency, no changes in weight recently.  No family history of colorectal cancer.   Past Medical History:  Diagnosis Date   Hypertension     Past Surgical History:  Procedure Laterality Date   BACK SURGERY  2007    Family History  Problem Relation Age of Onset   Hypertension Mother    Social History:  reports that he has quit smoking. His smoking use included cigarettes. He has a 10.00 pack-year smoking history. He has never used smokeless tobacco. He reports that he does not currently use alcohol. He reports current drug use. Drug: Marijuana.  Allergies:  Allergies  Allergen Reactions   Orange Juice [Orange Oil] Other (See Comments)    Sore throat    Medications Prior to Admission  Medication Sig Dispense Refill   amLODipine (NORVASC) 10 MG tablet Take 1 tablet (10 mg total) by mouth daily. 90 tablet 3   atorvastatin (LIPITOR) 10 MG tablet Take 1 tablet (10 mg total) by mouth daily. 90 tablet 3   cyclobenzaprine (FLEXERIL) 10 MG tablet TAKE 1 TABLET BY MOUTH ONCE DAILY AS NEEDED. 30 tablet 0   Multiple Vitamins-Minerals (MULTIVITAMIN WITH MINERALS) tablet Take 1 tablet by mouth daily.     zinc gluconate 50 MG tablet Take 50 mg by mouth daily.     azelastine (ASTELIN) 0.1 % nasal spray Place 2 sprays into both nostrils 2 (two) times daily. Use in each nostril as directed (Patient not taking: Reported on 05/07/2022) 30 mL 12   benzonatate (TESSALON) 100 MG capsule Take 1 capsule (100 mg total) by mouth 3 (three) times daily as needed for cough. Do not take with alcohol or while driving or operating heavy machinery (Patient not taking: Reported on 05/07/2022)  21 capsule 0   clotrimazole-betamethasone (LOTRISONE) cream APPLY TO AFFECTED AREAS TWICE DAILY. (Patient not taking: Reported on 05/07/2022) 45 g 0   ibuprofen (ADVIL) 800 MG tablet Take 1 tablet (800 mg total) by mouth every 8 (eight) hours as needed. (Patient not taking: Reported on 05/07/2022) 30 tablet 1   promethazine-dextromethorphan (PROMETHAZINE-DM) 6.25-15 MG/5ML syrup Take 5 mLs by mouth at bedtime as needed for cough. Do not take with alcohol or while driving or operating heavy machinery (Patient not taking: Reported on 05/07/2022) 118 mL 0   spironolactone (ALDACTONE) 50 MG tablet Take 1 tablet (50 mg total) by mouth daily. 90 tablet 3    Results for orders placed or performed in visit on 11/29/21 (from the past 48 hour(s))  Lipid panel     Status: Abnormal   Collection Time: 05/08/22 10:07 AM  Result Value Ref Range   Cholesterol, Total 168 100 - 199 mg/dL   Triglycerides 184 (H) 0 - 149 mg/dL   HDL 32 (L) >39 mg/dL   VLDL Cholesterol Cal 32 5 - 40 mg/dL   LDL Chol Calc (NIH) 104 (H) 0 - 99 mg/dL   Chol/HDL Ratio 5.3 (H) 0.0 - 5.0 ratio    Comment:                                   T.  Chol/HDL Ratio                                             Men  Women                               1/2 Avg.Risk  3.4    3.3                                   Avg.Risk  5.0    4.4                                2X Avg.Risk  9.6    7.1                                3X Avg.Risk 23.4   11.0   CMP14+EGFR     Status: None   Collection Time: 05/08/22 10:07 AM  Result Value Ref Range   Glucose 99 70 - 99 mg/dL   BUN 14 6 - 24 mg/dL   Creatinine, Ser 1.01 0.76 - 1.27 mg/dL   eGFR 89 >59 mL/min/1.73   BUN/Creatinine Ratio 14 9 - 20   Sodium 141 134 - 144 mmol/L   Potassium 4.3 3.5 - 5.2 mmol/L   Chloride 104 96 - 106 mmol/L   CO2 22 20 - 29 mmol/L   Calcium 9.5 8.7 - 10.2 mg/dL   Total Protein 6.9 6.0 - 8.5 g/dL   Albumin 4.4 3.8 - 4.9 g/dL   Globulin, Total 2.5 1.5 - 4.5 g/dL    Albumin/Globulin Ratio 1.8 1.2 - 2.2   Bilirubin Total 0.7 0.0 - 1.2 mg/dL   Alkaline Phosphatase 64 44 - 121 IU/L   AST 36 0 - 40 IU/L   ALT 44 0 - 44 IU/L  Hemoglobin A1c     Status: Abnormal   Collection Time: 05/08/22 10:07 AM  Result Value Ref Range   Hgb A1c MFr Bld 6.2 (H) 4.8 - 5.6 %    Comment:          Prediabetes: 5.7 - 6.4          Diabetes: >6.4          Glycemic control for adults with diabetes: <7.0    Est. average glucose Bld gHb Est-mCnc 131 mg/dL   No results found.  Review of Systems  All other systems reviewed and are negative.   Blood pressure 139/85, pulse 67, temperature 97.9 F (36.6 C), temperature source Oral, resp. rate 14, height _0  (1.778 m), weight 98.4 kg, SpO2 99 %. Physical Exam  GENERAL: The patient is AO x3, in no acute distress. HEENT: Head is normocephalic and atraumatic. EOMI are intact. Mouth is well hydrated and without lesions. NECK: Supple. No masses LUNGS: Clear to auscultation. No presence of rhonchi/wheezing/rales. Adequate chest expansion HEART: RRR, normal s1 and s2. ABDOMEN: Soft, nontender, no guarding, no peritoneal signs, and nondistended. BS +. No masses. EXTREMITIES: Without any cyanosis, clubbing, rash, lesions or edema. NEUROLOGIC: AOx3, no focal motor deficit. SKIN: no jaundice, no rashes  Assessment/Plan  51 y/o M with PMH HTN,  coming for screening colonoscopy. The patient is at average risk for colorectal cancer.  We will proceed with colonoscopy today.  Harvel Quale, MD 05/09/2022, 10:57 AM

## 2022-05-09 NOTE — Transfer of Care (Signed)
Immediate Anesthesia Transfer of Care Note  Patient: Evan Black  Procedure(s) Performed: COLONOSCOPY WITH PROPOFOL POLYPECTOMY INTESTINAL  Patient Location: Endoscopy Unit  Anesthesia Type:General  Level of Consciousness: awake, alert , oriented, and patient cooperative  Airway & Oxygen Therapy: Patient Spontanous Breathing  Post-op Assessment: Report given to RN, Post -op Vital signs reviewed and stable, and Patient moving all extremities  Post vital signs: Reviewed and stable  Last Vitals:  Vitals Value Taken Time  BP    Temp    Pulse    Resp    SpO2      Last Pain:  Vitals:   05/09/22 1101  TempSrc:   PainSc: 0-No pain      Patients Stated Pain Goal: 7 (46/04/79 [id number])  Complications: No notable events documented.

## 2022-05-09 NOTE — Anesthesia Postprocedure Evaluation (Signed)
Anesthesia Post Note  Patient: Evan Black  Procedure(s) Performed: COLONOSCOPY WITH PROPOFOL POLYPECTOMY INTESTINAL  Patient location during evaluation: Phase II Anesthesia Type: General Level of consciousness: awake and alert and oriented Pain management: pain level controlled Vital Signs Assessment: post-procedure vital signs reviewed and stable Respiratory status: spontaneous breathing, nonlabored ventilation and respiratory function stable Cardiovascular status: blood pressure returned to baseline and stable Postop Assessment: no apparent nausea or vomiting Anesthetic complications: no  No notable events documented.   Last Vitals:  Vitals:   05/09/22 1032 05/09/22 1138  BP: 139/85 (!) 102/52  Pulse: 67 (!) 56  Resp: 14 17  Temp: 36.6 C (!) 36.4 C  SpO2: 99% 96%    Last Pain:  Vitals:   05/09/22 1138  TempSrc: Axillary  PainSc: 0-No pain                 Naliah Eddington C Hiba Garry

## 2022-05-09 NOTE — Op Note (Signed)
[facility] Patient Name: Evan Black Procedure Date: 05/09/2022 10:50 AM MRN: [id number] Date of Birth: 10/08/1970 Attending MD: Maylon Peppers , , [id number] CSN: [id number] Age: 51 Admit Type: Outpatient Procedure:                Colonoscopy Indications:              Screening for colorectal malignant neoplasm Providers:                Maylon Peppers, George Page, [place] Risa Grill, Technician Referring MD:             Maylon Peppers Medicines:                Monitored Anesthesia Care Complications:            No immediate complications. Estimated Blood Loss:     Estimated blood loss: none. Procedure:                Pre-Anesthesia Assessment:                           - Prior to the procedure, a History and Physical                            was performed, and patient medications, allergies                            and sensitivities were reviewed. The patient's                            tolerance of previous anesthesia was reviewed.                           - The risks and benefits of the procedure and the                            sedation options and risks were discussed with the                            patient. All questions were answered and informed                            consent was obtained.                           - ASA Grade Assessment: II - A patient with mild                            systemic disease.                           After obtaining informed consent, the colonoscope                            was passed under direct vision. Throughout the  procedure, the patient's blood pressure, pulse, and                            oxygen saturations were monitored continuously. The                            [id number]-[id number] ([id number]) scope was introduced through                            the anus and advanced to the the terminal ileum.                            The colonoscopy was  performed without difficulty.                            The patient tolerated the procedure well. The                            quality of the bowel preparation was excellent. Scope In: 11:05:59 AM Scope Out: 11:34:22 AM Scope Withdrawal Time: 0 hours 24 minutes 38 seconds  Total Procedure Duration: 0 hours 28 minutes 23 seconds  Findings:      The perianal and digital rectal examinations were normal.      The terminal ileum appeared normal.      Five sessile polyps were found in the transverse colon, ascending colon       and cecum. The polyps were 2 to 6 mm in size. These polyps were removed       with a cold snare. Resection and retrieval were complete.      Scattered small-mouthed diverticula were found in the sigmoid colon.      The retroflexed view of the distal rectum and anal verge was normal and       showed no anal or rectal abnormalities. Impression:               - The examined portion of the ileum was normal.                           - Five 2 to 6 mm polyps in the transverse colon, in                            the ascending colon and in the cecum, removed with                            a cold snare. Resected and retrieved.                           - Diverticulosis in the sigmoid colon.                           - The distal rectum and anal verge are normal on                            retroflexion view. Moderate Sedation:      Per Anesthesia Care Recommendation:           -  Discharge patient to home (ambulatory).                           - Resume previous diet.                           - Await pathology results.                           - Repeat colonoscopy for surveillance based on                            pathology results. Procedure Code(s):        --- Professional ---                           [phone], Colonoscopy, flexible; with removal of                            tumor(s), polyp(s), or other lesion(s) by snare                             technique Diagnosis Code(s):        --- Professional ---                           Z12.11, Encounter for screening for malignant                            neoplasm of colon                           D12.3, Benign neoplasm of transverse colon (hepatic                            flexure or splenic flexure)                           D12.2, Benign neoplasm of ascending colon                           D12.0, Benign neoplasm of cecum                           K57.30, Diverticulosis of large intestine without                            perforation or abscess without bleeding CPT copyright 2022 [employer]. All rights reserved. The codes documented in this report are preliminary and upon coder review may  be revised to meet current compliance requirements. Maylon Peppers, MD Maylon Peppers,  05/09/2022 11:44:21 AM This report has been signed electronically. Number of Addenda: 0

## 2022-05-12 ENCOUNTER — Encounter (INDEPENDENT_AMBULATORY_CARE_PROVIDER_SITE_OTHER): Payer: Self-pay | Admitting: *Deleted

## 2022-05-12 LAB — SURGICAL PATHOLOGY

## 2022-05-13 ENCOUNTER — Ambulatory Visit: Payer: BC Managed Care – PPO | Admitting: Family Medicine

## 2022-05-16 ENCOUNTER — Encounter (HOSPITAL_COMMUNITY): Payer: Self-pay | Admitting: Gastroenterology

## 2022-05-30 ENCOUNTER — Encounter: Payer: Self-pay | Admitting: Family Medicine

## 2022-05-30 ENCOUNTER — Ambulatory Visit: Payer: BC Managed Care – PPO | Admitting: Family Medicine

## 2022-05-30 ENCOUNTER — Other Ambulatory Visit (HOSPITAL_COMMUNITY): Payer: BC Managed Care – PPO

## 2022-05-30 VITALS — BP 130/77 | HR 72 | Ht 70.0 in | Wt 216.1 lb

## 2022-05-30 DIAGNOSIS — E785 Hyperlipidemia, unspecified: Secondary | ICD-10-CM | POA: Diagnosis not present

## 2022-05-30 DIAGNOSIS — E66811 Obesity, class 1: Secondary | ICD-10-CM

## 2022-05-30 DIAGNOSIS — Z23 Encounter for immunization: Secondary | ICD-10-CM | POA: Diagnosis not present

## 2022-05-30 DIAGNOSIS — I10 Essential (primary) hypertension: Secondary | ICD-10-CM

## 2022-05-30 DIAGNOSIS — E669 Obesity, unspecified: Secondary | ICD-10-CM | POA: Diagnosis not present

## 2022-05-30 DIAGNOSIS — E8881 Metabolic syndrome: Secondary | ICD-10-CM

## 2022-05-30 DIAGNOSIS — R7303 Prediabetes: Secondary | ICD-10-CM

## 2022-05-30 MED ORDER — ATORVASTATIN CALCIUM 20 MG PO TABS: 20.0000 mg | ORAL_TABLET | Freq: Every day | ORAL | 1 refills | Status: DC

## 2022-05-30 MED ORDER — CLOTRIMAZOLE-BETAMETHASONE 1-0.05 % EX CREA: 1.0000 | TOPICAL_CREAM | Freq: Two times a day (BID) | CUTANEOUS | 2 refills | Status: DC

## 2022-05-30 NOTE — Patient Instructions (Addendum)
F/u first week in March, call if you ned me sooner   Flu vaccine in office  today  Higher dose of atorvastatin 20 mg daily  Continue change in food choice to improve health  Fasting lipid, cmp and eGFr and hBa1C 3 to 54 days before next visit  It is important that you exercise regularly at least 30 minutes 5 times a week. If you develop chest pain, have severe difficulty breathing, or feel very tired, stop exercising immediately and seek medical attention   Thanks for choosing  [facility], we consider it a privelige to serve you.

## 2022-06-03 ENCOUNTER — Encounter: Payer: Self-pay | Admitting: Family Medicine

## 2022-06-03 NOTE — Progress Notes (Signed)
Evan Black     MRN: [id number]      DOB: 1970/08/08   HPI Evan Black is here for follow up and re-evaluation of chronic medical conditions, medication management and review of any available recent lab and radiology data.  Preventive health is updated, specifically  Cancer screening and Immunization.   Questions or concerns regarding consultations or procedures which the PT has had in the interim are  addressed. The PT denies any adverse reactions to current medications since the last visit.  There are no new concerns.  There are no specific complaints   ROS Denies recent fever or chills. Denies sinus pressure, nasal congestion, ear pain or sore throat. Denies chest congestion, productive cough or wheezing. Denies chest pains, palpitations and leg swelling Denies abdominal pain, nausea, vomiting,diarrhea or constipation.   Denies dysuria, frequency, hesitancy or incontinence. Denies joint pain, swelling and limitation in mobility. Denies headaches, seizures, numbness, or tingling. Denies depression, anxiety or insomnia. Denies skin break down or rash.   PE  BP 130/77 (BP Location: Right Arm, Patient Position: Sitting, Cuff Size: Large)   Pulse 72   Ht '5\' 10"'$  (1.778 m)   Wt 216 lb 1.3 oz (98 kg)   SpO2 92%   BMI 31.00 kg/m   Patient alert and oriented and in no cardiopulmonary distress.  HEENT: No facial asymmetry, EOMI,     Neck supple .  Chest: Clear to auscultation bilaterally.  CVS: S1, S2 no murmurs, no S3.Regular rate.  ABD: Soft non tender.   Ext: No edema  MS: Adequate ROM spine, shoulders, hips and knees.  Skin: Intact, no ulcerations or rash noted.  Psych: Good eye contact, normal affect. Memory intact not anxious or depressed appearing.  CNS: CN 2-12 intact, power,  normal throughout.no focal deficits noted.   Assessment & Plan  Essential hypertension Controlled, no change in medication DASH diet and commitment to daily physical activity for a  minimum of 30 minutes discussed and encouraged, as a part of hypertension management. The importance of attaining a healthy weight is also discussed.     05/30/2022    2:55 PM 05/09/2022   11:38 AM 05/09/2022   10:32 AM 01/14/2022   12:20 PM 01/14/2022   12:18 PM 11/29/2021    8:48 AM 11/29/2021    8:29 AM  BP/Weight  Systolic BP 876 811 572  620 355 974  Diastolic BP 77 52 85  88 80 81  Wt. (Lbs) 216.08  217 216   215.04  BMI 31 kg/m2  31.14 kg/m2 30.99 kg/m2   30.86 kg/m2       Prediabetes Patient educated about the importance of limiting  Carbohydrate intake , the need to commit to daily physical activity for a minimum of 30 minutes , and to commit weight loss. The fact that changes in all these areas will reduce or eliminate all together the development of diabetes is stressed.      Latest Ref Rng & Units 05/08/2022   10:07 AM 10/09/2021    4:19 PM 04/24/2020    3:21 PM 04/24/2020    2:45 PM 12/20/2019    4:36 PM  Diabetic Labs  HbA1c 4.8 - 5.6 % 6.2  6.0  6.1   6.1   Chol 100 - 199 mg/dL 168  155    171   HDL >39 mg/dL 32  37    35   Calc LDL 0 - 99 mg/dL 104  84    104  Triglycerides 0 - 149 mg/dL 184  198    184   Creatinine 0.76 - 1.27 mg/dL 1.01  0.79   0.91  1.05       05/30/2022    2:55 PM 05/09/2022   11:38 AM 05/09/2022   10:32 AM 01/14/2022   12:20 PM 01/14/2022   12:18 PM 11/29/2021    8:48 AM 11/29/2021    8:29 AM  BP/Weight  Systolic BP 937 169 678  938 101 751  Diastolic BP 77 52 85  88 80 81  Wt. (Lbs) 216.08  217 216   215.04  BMI 31 kg/m2  31.14 kg/m2 30.99 kg/m2   30.86 kg/m2       No data to display          Deteriorated , needs toi work on lifestyle  Obesity (BMI 30.0-34.9)  Patient re-educated about  the importance of commitment to a  minimum of 150 minutes of exercise per week as able.  The importance of healthy food choices with portion control discussed, as well as eating regularly and within a 12 hour window most days. The need to  choose "clean , green" food 50 to 75% of the time is discussed, as well as to make water the primary drink and set a goal of 64 ounces water daily.       05/30/2022    2:55 PM 05/09/2022   10:32 AM 01/14/2022   12:20 PM  Weight /BMI  Weight 216 lb 1.3 oz 217 lb 216 lb  Height '5\' 10"'$  (1.778 m) '5\' 10"'$  (1.778 m) '5\' 10"'$  (1.778 m)  BMI 31 kg/m2 31.14 kg/m2 30.99 kg/m2      Hyperlipidemia with target LDL less than 100 Not at goal , inc med dose Hyperlipidemia:Low fat diet discussed and encouraged.   Lipid Panel  Lab Results  Component Value Date   CHOL 168 05/08/2022   HDL 32 (L) 05/08/2022   LDLCALC 104 (H) 05/08/2022   LDLDIRECT 86 06/01/2014   TRIG 184 (H) 05/08/2022   CHOLHDL 5.3 (H) 05/08/2022     Updated lab needed at/ before next visit.   Metabolic syndrome X The increased risk of cardiovascular disease associated with this diagnosis, and the need to consistently work on lifestyle to change this is discussed. Following  a  heart healthy diet ,commitment to 30 minutes of exercise at least 5 days per week, as well as control of blood sugar and cholesterol , and achieving a healthy weight are all the areas to be addressed .

## 2022-06-03 NOTE — Assessment & Plan Note (Signed)
Controlled, no change in medication DASH diet and commitment to daily physical activity for a minimum of 30 minutes discussed and encouraged, as a part of hypertension management. The importance of attaining a healthy weight is also discussed.     05/30/2022    2:55 PM 05/09/2022   11:38 AM 05/09/2022   10:32 AM 01/14/2022   12:20 PM 01/14/2022   12:18 PM 11/29/2021    8:48 AM 11/29/2021    8:29 AM  BP/Weight  Systolic BP 431 427 670  110 034 961  Diastolic BP 77 52 85  88 80 81  Wt. (Lbs) 216.08  217 216   215.04  BMI 31 kg/m2  31.14 kg/m2 30.99 kg/m2   30.86 kg/m2

## 2022-06-03 NOTE — Assessment & Plan Note (Signed)
Not at goal , inc med dose Hyperlipidemia:Low fat diet discussed and encouraged.   Lipid Panel  Lab Results  Component Value Date   CHOL 168 05/08/2022   HDL 32 (L) 05/08/2022   LDLCALC 104 (H) 05/08/2022   LDLDIRECT 86 06/01/2014   TRIG 184 (H) 05/08/2022   CHOLHDL 5.3 (H) 05/08/2022     Updated lab needed at/ before next visit.

## 2022-06-03 NOTE — Assessment & Plan Note (Signed)
  Patient re-educated about  the importance of commitment to a  minimum of 150 minutes of exercise per week as able.  The importance of healthy food choices with portion control discussed, as well as eating regularly and within a 12 hour window most days. The need to choose "clean , green" food 50 to 75% of the time is discussed, as well as to make water the primary drink and set a goal of 64 ounces water daily.       05/30/2022    2:55 PM 05/09/2022   10:32 AM 01/14/2022   12:20 PM  Weight /BMI  Weight 216 lb 1.3 oz 217 lb 216 lb  Height '5\' 10"'$  (1.778 m) '5\' 10"'$  (1.778 m) '5\' 10"'$  (1.778 m)  BMI 31 kg/m2 31.14 kg/m2 30.99 kg/m2

## 2022-06-03 NOTE — Assessment & Plan Note (Signed)
Patient educated about the importance of limiting  Carbohydrate intake , the need to commit to daily physical activity for a minimum of 30 minutes , and to commit weight loss. The fact that changes in all these areas will reduce or eliminate all together the development of diabetes is stressed.      Latest Ref Rng & Units 05/08/2022   10:07 AM 10/09/2021    4:19 PM 04/24/2020    3:21 PM 04/24/2020    2:45 PM 12/20/2019    4:36 PM  Diabetic Labs  HbA1c 4.8 - 5.6 % 6.2  6.0  6.1   6.1   Chol 100 - 199 mg/dL 168  155    171   HDL >39 mg/dL 32  37    35   Calc LDL 0 - 99 mg/dL 104  84    104   Triglycerides 0 - 149 mg/dL 184  198    184   Creatinine 0.76 - 1.27 mg/dL 1.01  0.79   0.91  1.05       05/30/2022    2:55 PM 05/09/2022   11:38 AM 05/09/2022   10:32 AM 01/14/2022   12:20 PM 01/14/2022   12:18 PM 11/29/2021    8:48 AM 11/29/2021    8:29 AM  BP/Weight  Systolic BP 543 606 770  340 352 481  Diastolic BP 77 52 85  88 80 81  Wt. (Lbs) 216.08  217 216   215.04  BMI 31 kg/m2  31.14 kg/m2 30.99 kg/m2   30.86 kg/m2       No data to display          Deteriorated , needs toi work on lifestyle

## 2022-06-03 NOTE — Assessment & Plan Note (Signed)
The increased risk of cardiovascular disease associated with this diagnosis, and the need to consistently work on lifestyle to change this is discussed. Following  a  heart healthy diet ,commitment to 30 minutes of exercise at least 5 days per week, as well as control of blood sugar and cholesterol , and achieving a healthy weight are all the areas to be addressed .  

## 2022-06-12 ENCOUNTER — Other Ambulatory Visit: Payer: Self-pay | Admitting: Family Medicine

## 2022-06-12 DIAGNOSIS — M545 Low back pain, unspecified: Secondary | ICD-10-CM

## 2022-06-12 DIAGNOSIS — M5416 Radiculopathy, lumbar region: Secondary | ICD-10-CM

## 2022-06-25 ENCOUNTER — Other Ambulatory Visit: Payer: BC Managed Care – PPO

## 2022-07-07 ENCOUNTER — Other Ambulatory Visit: Payer: BC Managed Care – PPO

## 2022-07-17 ENCOUNTER — Encounter (HOSPITAL_COMMUNITY): Payer: BC Managed Care – PPO | Admitting: Occupational Therapy

## 2022-08-09 IMAGING — CT CT CARDIAC CORONARY ARTERY CALCIUM SCORE
3 series · 14 of 20 positions shown, 15 images · non-contrast
Comparison: None.
COMPARISON: None.

Addendum:
EXAM:
OVER-READ INTERPRETATION  CT CHEST

The following report is an over-read performed by radiologist Dr.
Ferienhaus Erxleben [REDACTED] on 08/07/2020. This
over-read does not include interpretation of cardiac or coronary
anatomy or pathology. The coronary calcium score/coronary CTA
interpretation by the cardiologist is attached.
CLINICAL DATA: Risk stratification
Coronary Calcium Score
TECHNIQUE: The patient was scanned on a Siemens Somatom 64 slice scanner. Axial
non-contrast 3 mm slices were carried out through the heart. The
data set was analyzed on a dedicated work station and scored using
the Agatson method.

[Series 2: casc 3.0 bv41 2 bestsyst 31 % · axial · 0.39mm/px · z∈[-216,-150]mm · 4 of 38 slices shown, 5 images]
[im 8/38  vessel]
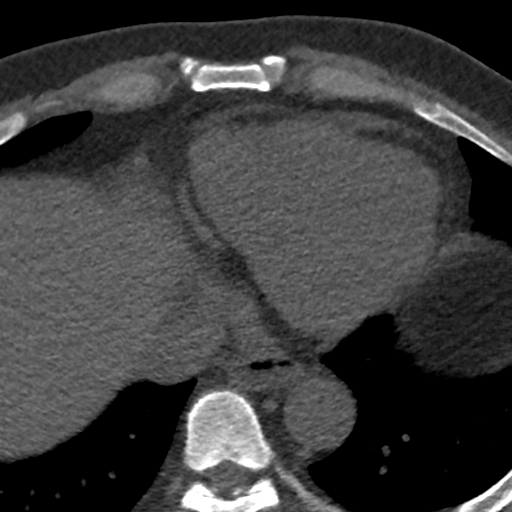
[im 8/38  lung]
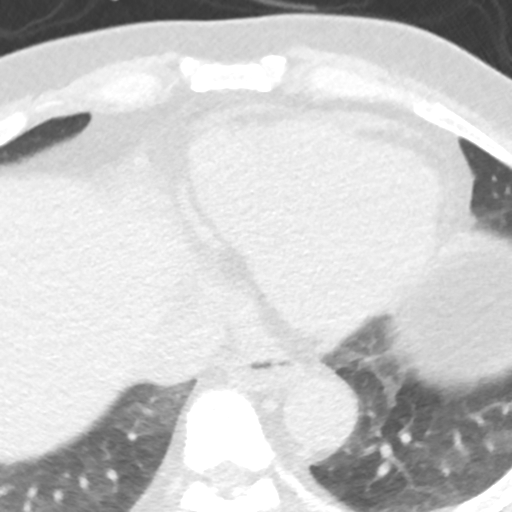
[im 15/38  vessel]
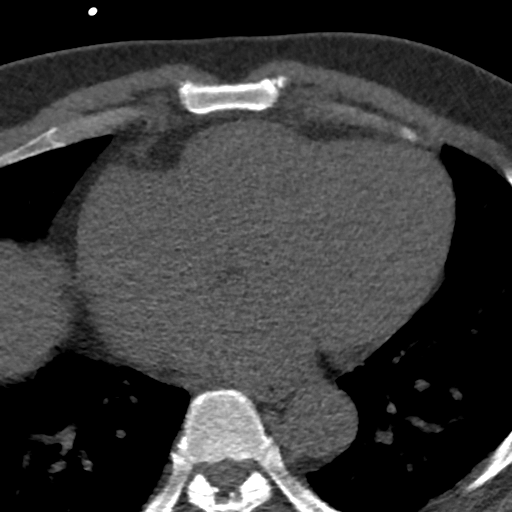
[im 23/38  vessel]
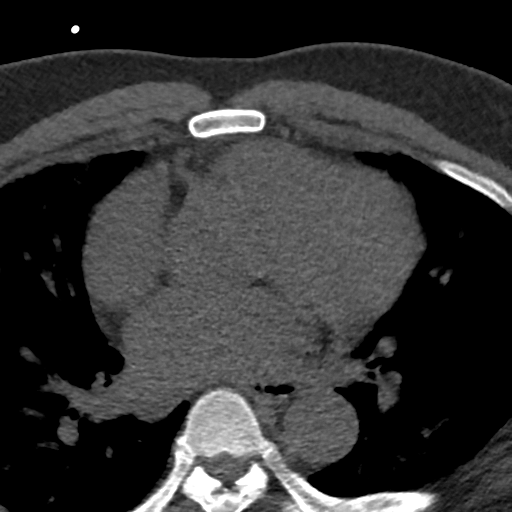
[im 30/38  vessel]
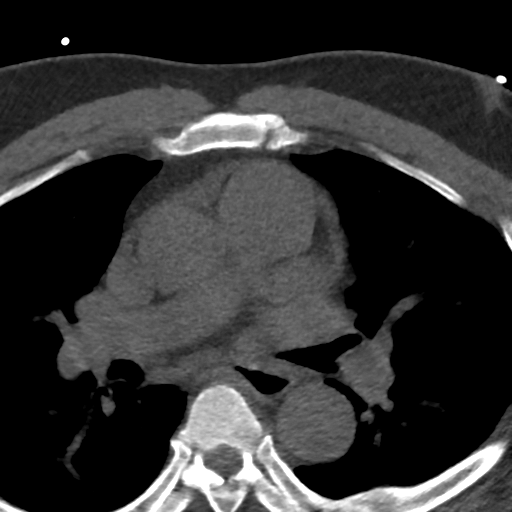

[Series 3: lung 31 % · axial · 0.68mm/px · z∈[-219,-147]mm · 5 of 38 slices shown]
[im 7/38  lung]
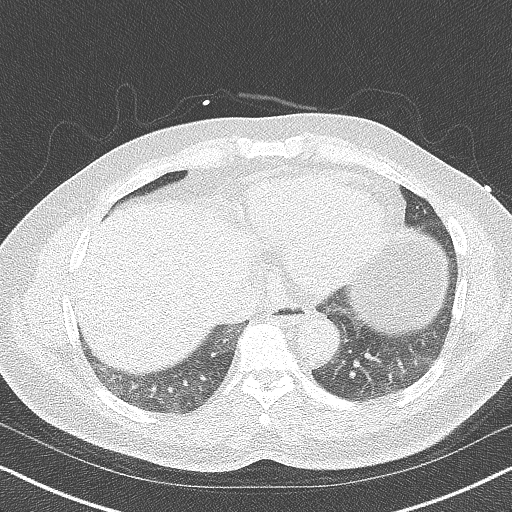
[im 13/38  lung]
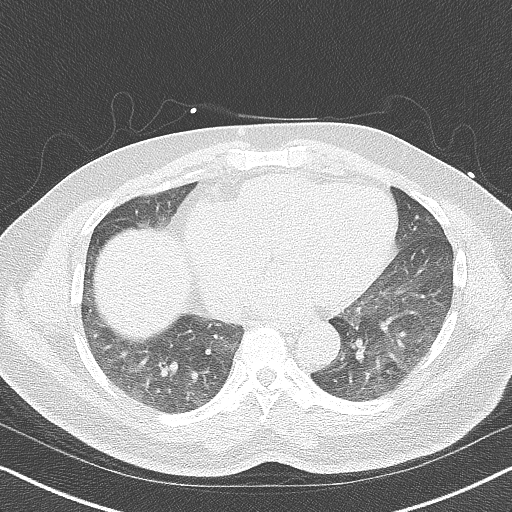
[im 19/38  lung]
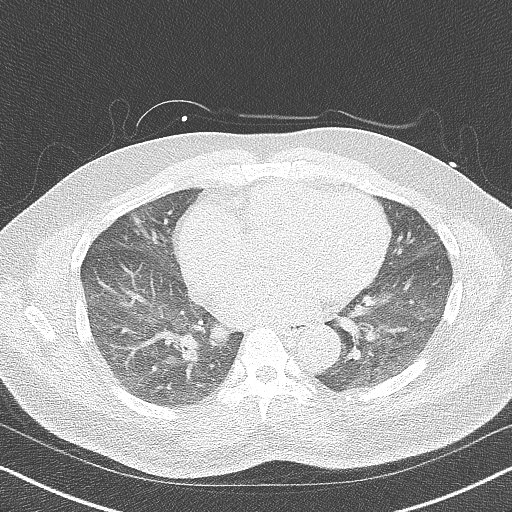
[im 25/38  lung]
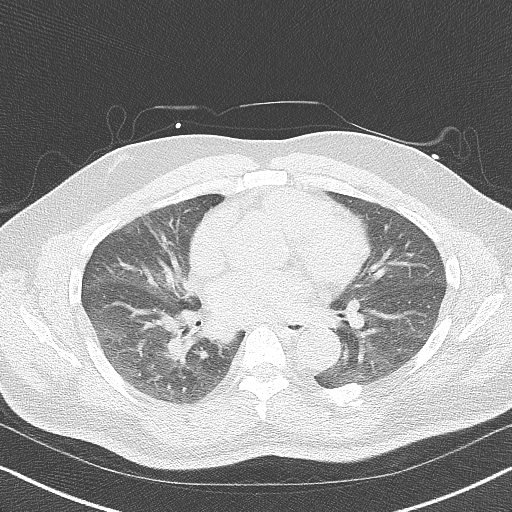
[im 31/38  lung]
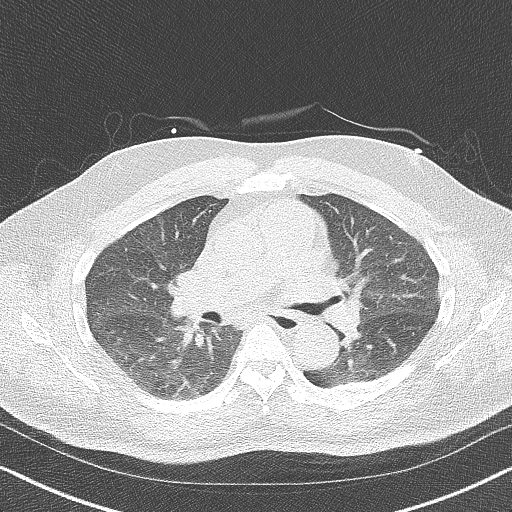

[Series 4: lung st 31 % · axial · 0.68mm/px · z∈[-219,-147]mm · 5 of 38 slices shown]
[im 7/38  lung]
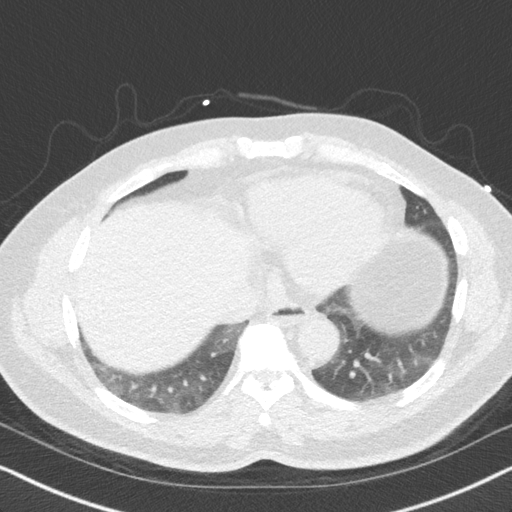
[im 13/38  lung]
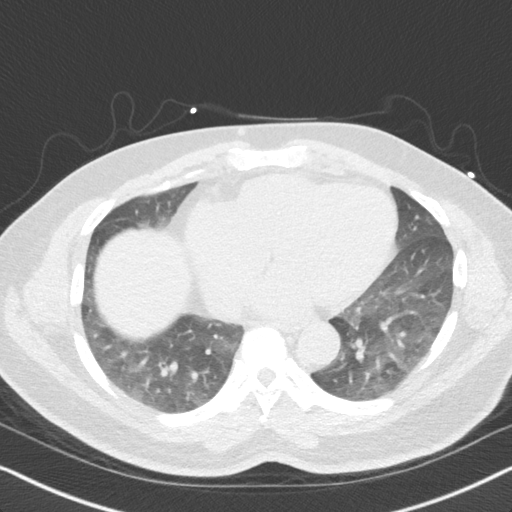
[im 19/38  lung]
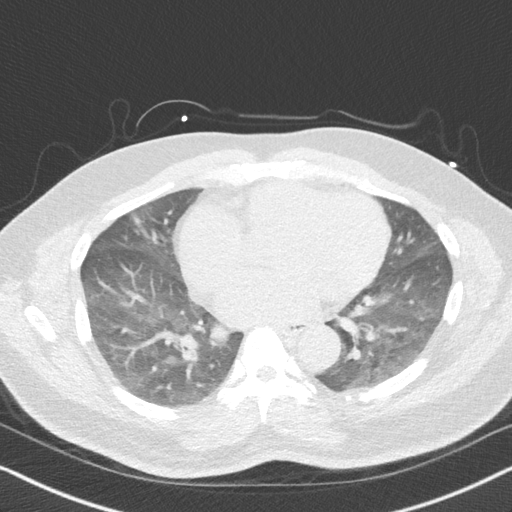
[im 25/38  lung]
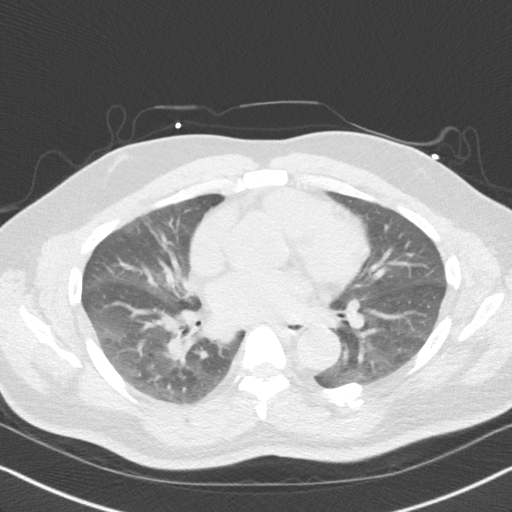
[im 31/38  lung]
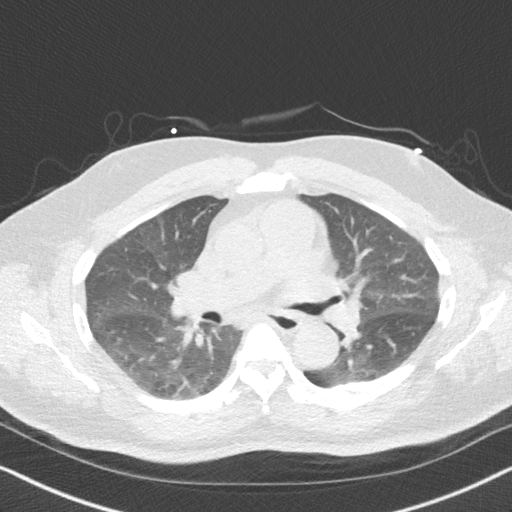

[14 of 20 positions shown; findings below may reference images not displayed]

FINDINGS: Mosaic attenuation in the lungs, suggestive of air trapping. Within
the visualized portions of the thorax there are no suspicious
appearing pulmonary nodules or masses, there is no acute
consolidative airspace disease, no pleural effusions, no
pneumothorax and no lymphadenopathy. Visualized portions of the
upper abdomen are unremarkable. There are no aggressive appearing
lytic or blastic lesions noted in the visualized portions of the
skeleton.
IMPRESSION: 1. The appearance of the lungs may suggest air trapping from small
airways disease.
FINDINGS: Non-cardiac: See separate report from [REDACTED].

Ascending aorta: Normal diameter 3.1 cm

Pericardium: Normal

Coronary arteries: No calcium noted
IMPRESSION: Coronary calcium score of 0.

Heloyse Governatori

*** End of Addendum ***
EXAM:
OVER-READ INTERPRETATION  CT CHEST

The following report is an over-read performed by radiologist Dr.
Ferienhaus Erxleben [REDACTED] on 08/07/2020. This
over-read does not include interpretation of cardiac or coronary
anatomy or pathology. The coronary calcium score/coronary CTA
interpretation by the cardiologist is attached.
FINDINGS: Mosaic attenuation in the lungs, suggestive of air trapping. Within
the visualized portions of the thorax there are no suspicious
appearing pulmonary nodules or masses, there is no acute
consolidative airspace disease, no pleural effusions, no
pneumothorax and no lymphadenopathy. Visualized portions of the
upper abdomen are unremarkable. There are no aggressive appearing
lytic or blastic lesions noted in the visualized portions of the
skeleton.
IMPRESSION: 1. The appearance of the lungs may suggest air trapping from small
airways disease.

## 2022-08-15 ENCOUNTER — Ambulatory Visit: Payer: BC Managed Care – PPO | Admitting: Family Medicine

## 2022-08-21 ENCOUNTER — Encounter: Payer: Self-pay | Admitting: Family Medicine

## 2022-08-21 ENCOUNTER — Ambulatory Visit: Payer: BC Managed Care – PPO | Admitting: Family Medicine

## 2022-08-21 VITALS — BP 145/86 | HR 92 | Ht 70.0 in | Wt 226.1 lb

## 2022-08-21 DIAGNOSIS — M5440 Lumbago with sciatica, unspecified side: Secondary | ICD-10-CM | POA: Diagnosis not present

## 2022-08-21 DIAGNOSIS — E785 Hyperlipidemia, unspecified: Secondary | ICD-10-CM

## 2022-08-21 DIAGNOSIS — I10 Essential (primary) hypertension: Secondary | ICD-10-CM | POA: Diagnosis not present

## 2022-08-21 DIAGNOSIS — R7303 Prediabetes: Secondary | ICD-10-CM | POA: Diagnosis not present

## 2022-08-21 DIAGNOSIS — Z125 Encounter for screening for malignant neoplasm of prostate: Secondary | ICD-10-CM

## 2022-08-21 DIAGNOSIS — E669 Obesity, unspecified: Secondary | ICD-10-CM

## 2022-08-21 MED ORDER — METHYLPREDNISOLONE ACETATE 80 MG/ML IJ SUSP
80.0000 mg | Freq: Once | INTRAMUSCULAR | Status: AC
  Administered 2022-08-21: 80 mg via INTRAMUSCULAR

## 2022-08-21 MED ORDER — PREDNISONE 10 MG (21) PO TBPK: ORAL_TABLET | ORAL | 0 refills | Status: DC

## 2022-08-21 MED ORDER — SPIRONOLACTONE 100 MG PO TABS: 100.0000 mg | ORAL_TABLET | Freq: Every day | ORAL | 2 refills | Status: DC

## 2022-08-21 NOTE — Patient Instructions (Addendum)
Annual exam 2nd week in May,and to re evalute  blood pressure, call if you need me sooner  NEW higher dose of spironolactone for blood pressure, 100 mg ONCE daily, stop spironolactone 50 mg tablet  Continue amlodipine as before  Depomedrol 80 mg IM in office for back pain with left sciatica, and also 6 day course of predniosne tablets is prescribed  Continue flexeril as needed at night for back spasm  Work on weight loss  Fasting labs ordered, May 4 or shortly after  Thanks for choosing [facility], we consider it a privelige to serve you.

## 2022-08-25 NOTE — Progress Notes (Signed)
Method Sater     MRN: [id number]      DOB: [birth year]   HPI [name] is here for follow up and re-evaluation of chronic medical conditions, medication management and review of any available recent lab and radiology data.  Preventive health is updated, specifically  Cancer screening and Immunization.   Questions or concerns regarding consultations or procedures which the PT has had in the interim are  addressed. The PT denies any adverse reactions to current medications since the last visit.  There are no new concerns.  There are no specific complaints   ROS Denies recent fever or chills. Denies sinus pressure, nasal congestion, ear pain or sore throat. Denies chest congestion, productive cough or wheezing. Denies chest pains, palpitations and leg swelling Denies abdominal pain, nausea, vomiting,diarrhea or constipation.   Denies dysuria, frequency, hesitancy or incontinence. Denies joint pain, swelling and limitation in mobility. Denies headaches, seizures, numbness, or tingling. Denies depression, anxiety or insomnia. Denies skin break down or rash.   PE  BP (!) 145/86 (BP Location: Left Arm, Patient Position: Sitting, Cuff Size: Large)   Pulse 92   Ht 5\' 10"  (1.778 m)   Wt 226 lb 1.9 oz (102.6 kg)   SpO2 96%   BMI 32.44 kg/m   Patient alert and oriented and in no cardiopulmonary distress.  HEENT: No facial asymmetry, EOMI,     Neck supple .  Chest: Clear to auscultation bilaterally.  CVS: S1, S2 no murmurs, no S3.Regular rate.  ABD: Soft non tender.   Ext: No edema  MS: Adequate ROM spine, shoulders, hips and knees.  Skin: Intact, no ulcerations or rash noted.  Psych: Good eye contact, normal affect. Memory intact not anxious or depressed appearing.  CNS: CN 2-12 intact, power,  normal throughout.no focal deficits noted.   Assessment & Plan  Back pain Uncontrolled.d depo medrol 80 mg IM dministered IM in the office , to be followed by a short course  of oral prednisone .   Essential hypertension Uncontrolled increaae dose of spironolactome DASH diet and commitment to daily physical activity for a minimum of 30 minutes discussed and encouraged, as a part of hypertension management. The importance of attaining a healthy weight is also discussed.     08/21/2022    4:29 PM 08/21/2022    4:26 PM 05/30/2022    2:55 PM 05/09/2022   11:38 AM 05/09/2022   10:32 AM 01/14/2022   12:20 PM 01/14/2022   12:18 PM  BP/Weight  Systolic BP [id number]  [id number]  Diastolic BP 86 96 77 52 85  88  Wt. (Lbs)  226.12 216.08  217 216   BMI  32.44 kg/m2 31 kg/m2  31.14 kg/m2 30.99 kg/m2        Obesity (BMI 30.0-34.9) Marked weight rer gain  Patient re-educated about  the importance of commitment to a  minimum of 150 minutes of exercise per week as able.  The importance of healthy food choices with portion control discussed, as well as eating regularly and within a 12 hour window most days. The need to choose "clean , green" food 50 to 75% of the time is discussed, as well as to make water the primary drink and set a goal of 64 ounces water daily.       08/21/2022    4:26 PM 05/30/2022    2:55 PM 05/09/2022   10:32 AM  Weight /BMI  Weight 226 lb 1.9 oz 216 lb 1.3 oz  217 lb  Height 5\' 10"  (1.778 m) 5\' 10"  (1.778 m) 5\' 10"  (1.778 m)  BMI 32.44 kg/m2 31 kg/m2 31.14 kg/m2      Hyperlipidemia with target LDL less than 100 Hyperlipidemia:Low fat diet discussed and encouraged.   Lipid Panel  Lab Results  Component Value Date   CHOL 168 05/08/2022   HDL 32 (L) 05/08/2022   LDLCALC 104 (H) 05/08/2022   LDLDIRECT 86 06/01/2014   TRIG 184 (H) 05/08/2022   CHOLHDL 5.3 (H) 05/08/2022     Need to  reduce fat intake and take med as prescribed Uncontrolled and inc risk of  vascular dz

## 2022-08-25 NOTE — Assessment & Plan Note (Signed)
Hyperlipidemia:Low fat diet discussed and encouraged.   Lipid Panel  Lab Results  Component Value Date   CHOL 168 05/08/2022   HDL 32 (L) 05/08/2022   LDLCALC 104 (H) 05/08/2022   LDLDIRECT 86 06/01/2014   TRIG 184 (H) 05/08/2022   CHOLHDL 5.3 (H) 05/08/2022     Need to  reduce fat intake and take med as prescribed Uncontrolled and inc risk of  vascular dz

## 2022-08-25 NOTE — Assessment & Plan Note (Signed)
Uncontrolled increaae dose of spironolactome DASH diet and commitment to daily physical activity for a minimum of 30 minutes discussed and encouraged, as a part of hypertension management. The importance of attaining a healthy weight is also discussed.     08/21/2022    4:29 PM 08/21/2022    4:26 PM 05/30/2022    2:55 PM 05/09/2022   11:38 AM 05/09/2022   10:32 AM 01/14/2022   12:20 PM 01/14/2022   12:18 PM  BP/Weight  Systolic BP Q000111Q [id number]  [id number]  Diastolic BP 86 96 77 52 85  88  Wt. (Lbs)  226.12 216.08  217 216   BMI  32.44 kg/m2 31 kg/m2  31.14 kg/m2 30.99 kg/m2

## 2022-08-25 NOTE — Assessment & Plan Note (Signed)
Uncontrolled.d depo medrol 80 mg IM dministered IM in the office , to be followed by a short course of oral prednisone .

## 2022-08-25 NOTE — Assessment & Plan Note (Signed)
Marked weight rer gain  Patient re-educated about  the importance of commitment to a  minimum of 150 minutes of exercise per week as able.  The importance of healthy food choices with portion control discussed, as well as eating regularly and within a 12 hour window most days. The need to choose "clean , green" food 50 to 75% of the time is discussed, as well as to make water the primary drink and set a goal of 64 ounces water daily.       08/21/2022    4:26 PM 05/30/2022    2:55 PM 05/09/2022   10:32 AM  Weight /BMI  Weight 226 lb 1.9 oz 216 lb 1.3 oz 217 lb  Height 5\' 10"  (1.778 m) 5\' 10"  (1.778 m) 5\' 10"  (1.778 m)  BMI 32.44 kg/m2 31 kg/m2 31.14 kg/m2

## 2022-08-27 ENCOUNTER — Encounter: Payer: Self-pay | Admitting: Family Medicine

## 2022-08-27 MED ORDER — HYDROCODONE-ACETAMINOPHEN 5-325 MG PO TABS: ORAL_TABLET | ORAL | 0 refills | Status: DC

## 2022-10-01 ENCOUNTER — Other Ambulatory Visit: Payer: Self-pay | Admitting: Physician Assistant

## 2022-10-01 DIAGNOSIS — M5416 Radiculopathy, lumbar region: Secondary | ICD-10-CM

## 2022-10-17 ENCOUNTER — Other Ambulatory Visit: Payer: BC Managed Care – PPO

## 2022-10-17 ENCOUNTER — Encounter: Payer: Self-pay | Admitting: Family Medicine

## 2022-10-17 ENCOUNTER — Ambulatory Visit: Payer: BC Managed Care – PPO | Admitting: Family Medicine

## 2022-10-17 VITALS — BP 140/90 | HR 82 | Ht 70.0 in | Wt 223.1 lb

## 2022-10-17 DIAGNOSIS — I10 Essential (primary) hypertension: Secondary | ICD-10-CM | POA: Diagnosis not present

## 2022-10-17 DIAGNOSIS — E669 Obesity, unspecified: Secondary | ICD-10-CM | POA: Diagnosis not present

## 2022-10-17 DIAGNOSIS — M5440 Lumbago with sciatica, unspecified side: Secondary | ICD-10-CM | POA: Diagnosis not present

## 2022-10-17 LAB — CMP14+EGFR
ALT: 34 IU/L (ref 0–44)
AST: 22 IU/L (ref 0–40)
Albumin/Globulin Ratio: 1.7 (ref 1.2–2.2)
Albumin: 4.2 g/dL (ref 3.8–4.9)
Alkaline Phosphatase: 65 IU/L (ref 44–121)
BUN/Creatinine Ratio: 13 (ref 9–20)
BUN: 11 mg/dL (ref 6–24)
Bilirubin Total: 0.3 mg/dL (ref 0.0–1.2)
CO2: 23 mmol/L (ref 20–29)
Calcium: 9.2 mg/dL (ref 8.7–10.2)
Chloride: 102 mmol/L (ref 96–106)
Creatinine, Ser: 0.85 mg/dL (ref 0.76–1.27)
Globulin, Total: 2.5 g/dL (ref 1.5–4.5)
Glucose: 86 mg/dL (ref 70–99)
Potassium: 3.8 mmol/L (ref 3.5–5.2)
Sodium: 141 mmol/L (ref 134–144)
Total Protein: 6.7 g/dL (ref 6.0–8.5)
eGFR: 104 mL/min/{1.73_m2} (ref >59)

## 2022-10-17 LAB — CBC
Hematocrit: 38.2 % (ref 37.5–51.0)
Hemoglobin: 13 g/dL (ref 13.0–17.7)
MCH: 29.9 pg (ref 26.6–33.0)
MCHC: 34 g/dL (ref 31.5–35.7)
MCV: 88 fL (ref 79–97)
Platelets: 314 10*3/uL (ref 150–450)
RBC: 4.35 x10E6/uL (ref 4.14–5.80)
RDW: 13.5 % (ref 11.6–15.4)
WBC: 8.9 10*3/uL (ref 3.4–10.8)

## 2022-10-17 LAB — HEMOGLOBIN A1C
Est. average glucose Bld gHb Est-mCnc: 137 mg/dL
Hgb A1c MFr Bld: 6.4 % — ABNORMAL HIGH (ref 4.8–5.6)

## 2022-10-17 LAB — LIPID PANEL
Chol/HDL Ratio: 3.7 ratio (ref 0.0–5.0)
Cholesterol, Total: 141 mg/dL (ref 100–199)
HDL: 38 mg/dL — ABNORMAL LOW (ref >39)
LDL Chol Calc (NIH): 83 mg/dL (ref 0–99)
Triglycerides: 109 mg/dL (ref 0–149)
VLDL Cholesterol Cal: 20 mg/dL (ref 5–40)

## 2022-10-17 LAB — PSA: Prostate Specific Ag, Serum: 0.2 ng/mL (ref 0.0–4.0)

## 2022-10-17 LAB — TSH: TSH: 1.51 u[IU]/mL (ref 0.450–4.500)

## 2022-10-17 MED ORDER — ATORVASTATIN CALCIUM 20 MG PO TABS: 20.0000 mg | ORAL_TABLET | Freq: Every day | ORAL | 1 refills | Status: DC

## 2022-10-17 MED ORDER — CYCLOBENZAPRINE HCL 10 MG PO TABS: ORAL_TABLET | ORAL | 0 refills | Status: DC

## 2022-10-17 MED ORDER — OLMESARTAN MEDOXOMIL 20 MG PO TABS: 20.0000 mg | ORAL_TABLET | Freq: Every day | ORAL | 3 refills | Status: DC

## 2022-10-17 MED ORDER — TRAMADOL HCL 50 MG PO TABS: 50.0000 mg | ORAL_TABLET | Freq: Two times a day (BID) | ORAL | 1 refills | Status: AC | PRN

## 2022-10-17 MED ORDER — FAMOTIDINE 40 MG PO TABS: 40.0000 mg | ORAL_TABLET | Freq: Every day | ORAL | 0 refills | Status: DC

## 2022-10-17 MED ORDER — SPIRONOLACTONE 100 MG PO TABS: 100.0000 mg | ORAL_TABLET | Freq: Every day | ORAL | 3 refills | Status: DC

## 2022-10-17 MED ORDER — IBUPROFEN 800 MG PO TABS: ORAL_TABLET | ORAL | 0 refills | Status: DC

## 2022-10-17 NOTE — Patient Instructions (Signed)
F/U in 8 to 10 weeks re evaluate blood pressure, call if you need me sooner  New  additional; medication for blood pressure is olmesartan 20 mg daily Continue amlodipine and spironolactone   For pain use tramadol one twice daily and tylenol 5 500 mg one twice daily  Maximum ibuprofen 800 mg one daily  Pepcid prescribed once daily to protect your stomach  Thanks for choosing [facility], we consider it a privelige to serve you.

## 2022-10-17 NOTE — Progress Notes (Unsigned)
   Evan Black     MRN: [id number]      DOB: [birth year]  Chief Complaint  Patient presents with   Follow-up    Follow up leg pain     HPI Mr. Music is here for follow up and re-evaluation of chronic medical conditions, medication management and review of any available recent lab and radiology data.  Preventive health is updated, specifically  Cancer screening and Immunization.   Questions or concerns regarding consultations or procedures which the PT has had in the interim are  addressed. The PT denies any adverse reactions to current medications since the last visit.  2 month h/o weakness , buckling , falls   ROS Denies recent fever or chills. Denies sinus pressure, nasal congestion, ear pain or sore throat. Denies chest congestion, productive cough or wheezing. Denies chest pains, palpitations and leg swelling Denies abdominal pain, nausea, vomiting,diarrhea or constipation.   Denies dysuria, frequency, hesitancy or incontinence. Denies joint pain, swelling and limitation in mobility. Denies headaches, seizures, numbness, or tingling. Denies depression, anxiety or insomnia. Denies skin break down or rash.   PE  BP (!) 143/91 (BP Location: Left Arm, Patient Position: Sitting, Cuff Size: Large)   Pulse 82   Ht 5\' 10"  (1.778 m)   Wt 223 lb 1.9 oz (101.2 kg)   SpO2 96%   BMI 32.01 kg/m   Patient alert and oriented and in no cardiopulmonary distress.  HEENT: No facial asymmetry, EOMI,     Neck supple .  Chest: Clear to auscultation bilaterally.  CVS: S1, S2 no murmurs, no S3.Regular rate.  ABD: Soft non tender.   Ext: No edema  MS: Adequate ROM spine, shoulders, hips and knees.  Skin: Intact, no ulcerations or rash noted.  Psych: Good eye contact, normal affect. Memory intact not anxious or depressed appearing.  CNS: CN 2-12 intact, power,  normal throughout.no focal deficits noted.   Assessment & Plan  No problem-specific Assessment & Plan notes found for  this encounter.

## 2022-10-20 NOTE — Assessment & Plan Note (Signed)
Uncontrolled, add olmesartan 20 mg DASH diet and commitment to daily physical activity for a minimum of 30 minutes discussed and encouraged, as a part of hypertension management. The importance of attaining a healthy weight is also discussed.     10/17/2022    4:32 PM 10/17/2022    4:01 PM 10/17/2022    4:00 PM 08/21/2022    4:29 PM 08/21/2022    4:26 PM 05/30/2022    2:55 PM 05/09/2022   11:38 AM  BP/Weight  Systolic BP 140 143 154 145 155 130 102  Diastolic BP 90 91 90 86 96 77 52  Wt. (Lbs)   223.12  226.12 216.08   BMI   32.01 kg/m2  32.44 kg/m2 31 kg/m2

## 2022-10-20 NOTE — Assessment & Plan Note (Signed)
  Patient re-educated about  the importance of commitment to a  minimum of 150 minutes of exercise per week as able.  The importance of healthy food choices with portion control discussed, as well as eating regularly and within a 12 hour window most days. The need to choose "clean , green" food 50 to 75% of the time is discussed, as well as to make water the primary drink and set a goal of 64 ounces water daily.       10/17/2022    4:00 PM 08/21/2022    4:26 PM 05/30/2022    2:55 PM  Weight /BMI  Weight 223 lb 1.9 oz 226 lb 1.9 oz 216 lb 1.3 oz  Height 5\' 10"  (1.778 m) 5\' 10"  (1.778 m) 5\' 10"  (1.778 m)  BMI 32.01 kg/m2 32.44 kg/m2 31 kg/m2    Significant weight gain in past 5 months, needs to work o this, with reduced mobility and increased steroids this is a challenge

## 2022-10-20 NOTE — Assessment & Plan Note (Addendum)
Uncontrolled needs to reduce ibuprofen Commit to twice daily tramadol and tylenol  Max ibuprofen 800 mg daily with pepcid added

## 2022-10-25 ENCOUNTER — Other Ambulatory Visit: Payer: BC Managed Care – PPO

## 2022-11-21 ENCOUNTER — Ambulatory Visit
Admission: RE | Admit: 2022-11-21 | Discharge: 2022-11-21 | Disposition: A | Discharge status: Home / Self Care | Payer: BC Managed Care – PPO | Source: Ambulatory Visit | Attending: Physician Assistant | Admitting: Physician Assistant

## 2022-11-21 DIAGNOSIS — M5416 Radiculopathy, lumbar region: Secondary | ICD-10-CM

## 2022-12-05 ENCOUNTER — Encounter: Payer: BC Managed Care – PPO | Admitting: Family Medicine

## 2022-12-12 ENCOUNTER — Other Ambulatory Visit: Payer: BC Managed Care – PPO

## 2022-12-17 ENCOUNTER — Other Ambulatory Visit: Payer: BC Managed Care – PPO

## 2022-12-17 ENCOUNTER — Ambulatory Visit: Payer: BC Managed Care – PPO | Admitting: Family Medicine

## 2023-01-02 ENCOUNTER — Other Ambulatory Visit: Payer: Self-pay | Admitting: Family Medicine

## 2023-01-07 ENCOUNTER — Encounter: Payer: BC Managed Care – PPO | Admitting: Family Medicine

## 2023-01-08 ENCOUNTER — Encounter: Payer: Self-pay | Admitting: Family Medicine

## 2023-01-20 ENCOUNTER — Encounter: Payer: Self-pay | Admitting: Family Medicine

## 2023-01-20 ENCOUNTER — Other Ambulatory Visit: Payer: Self-pay

## 2023-01-20 MED ORDER — FAMOTIDINE 40 MG PO TABS: 40.0000 mg | ORAL_TABLET | Freq: Every day | ORAL | 0 refills | Status: DC

## 2023-01-20 NOTE — Telephone Encounter (Signed)
Refill sent.

## 2023-01-31 ENCOUNTER — Other Ambulatory Visit: Payer: Self-pay | Admitting: Family Medicine

## 2023-03-08 ENCOUNTER — Other Ambulatory Visit: Payer: Self-pay | Admitting: Family Medicine

## 2023-04-07 ENCOUNTER — Ambulatory Visit (INDEPENDENT_AMBULATORY_CARE_PROVIDER_SITE_OTHER): Payer: BC Managed Care – PPO | Admitting: Family Medicine

## 2023-04-07 ENCOUNTER — Encounter: Payer: Self-pay | Admitting: Family Medicine

## 2023-04-07 VITALS — BP 178/118 | HR 101 | Ht 70.0 in | Wt 220.1 lb

## 2023-04-07 DIAGNOSIS — E66811 Obesity, class 1: Secondary | ICD-10-CM

## 2023-04-07 DIAGNOSIS — R7303 Prediabetes: Secondary | ICD-10-CM

## 2023-04-07 DIAGNOSIS — E785 Hyperlipidemia, unspecified: Secondary | ICD-10-CM

## 2023-04-07 DIAGNOSIS — M5440 Lumbago with sciatica, unspecified side: Secondary | ICD-10-CM | POA: Diagnosis not present

## 2023-04-07 DIAGNOSIS — Z0001 Encounter for general adult medical examination with abnormal findings: Secondary | ICD-10-CM | POA: Insufficient documentation

## 2023-04-07 DIAGNOSIS — I10 Essential (primary) hypertension: Secondary | ICD-10-CM | POA: Diagnosis not present

## 2023-04-07 DIAGNOSIS — Z23 Encounter for immunization: Secondary | ICD-10-CM | POA: Diagnosis not present

## 2023-04-07 MED ORDER — AMLODIPINE-OLMESARTAN 10-40 MG PO TABS: 1.0000 | ORAL_TABLET | Freq: Every day | ORAL | 3 refills | Status: DC

## 2023-04-07 MED ORDER — HYDROCHLOROTHIAZIDE 12.5 MG PO CAPS: 12.5000 mg | ORAL_CAPSULE | Freq: Every day | ORAL | 2 refills | Status: DC

## 2023-04-07 MED ORDER — PANTOPRAZOLE SODIUM 40 MG PO TBEC: 40.0000 mg | DELAYED_RELEASE_TABLET | Freq: Every day | ORAL | 1 refills | Status: DC

## 2023-04-07 NOTE — Assessment & Plan Note (Signed)
Hyperlipidemia:Low fat diet discussed and encouraged.   Lipid Panel  Lab Results  Component Value Date   CHOL 141 10/15/2022   HDL 38 (L) 10/15/2022   LDLCALC 83 10/15/2022   LDLDIRECT 86 06/01/2014   TRIG 109 10/15/2022   CHOLHDL 3.7 10/15/2022     Updated lab needed at/ before next visit.

## 2023-04-07 NOTE — Assessment & Plan Note (Signed)
Increased , recently had epidural with some relief

## 2023-04-07 NOTE — Assessment & Plan Note (Signed)
After obtaining informed consent, the vaccine is  administered , with no adverse effect noted at the time of administration.  

## 2023-04-07 NOTE — Assessment & Plan Note (Signed)
Uncontrolled New is azor 10/40 one daily and hydrochlorothiazide 12.5 mg daily Re eval in 4 months DASH diet and commitment to daily physical activity for a minimum of 30 minutes discussed and encouraged, as a part of hypertension management. The importance of attaining a healthy weight is also discussed.     04/07/2023    4:15 PM 04/07/2023    4:13 PM 10/17/2022    4:32 PM 10/17/2022    4:01 PM 10/17/2022    4:00 PM 08/21/2022    4:29 PM 08/21/2022    4:26 PM  BP/Weight  Systolic BP 178 189 140 143 154 145 155  Diastolic BP 118 112 90 91 90 86 96  Wt. (Lbs)  220.12   223.12  226.12  BMI  31.58 kg/m2   32.01 kg/m2  32.44 kg/m2

## 2023-04-07 NOTE — Patient Instructions (Addendum)
F/U in 4 weeks, re evaluate blood pressure and lab review call if you need me sooner  New for BP  Azor 10/40 ONE daily Hydrochlorothiazide 12.5 ONE daily  New for reflux pantoprazole 40 mg one daily, no caffeine and no eating after 7:30 at night  Fasting lipid, cmp and EGFR and hBA1C to be dione 3 to 5 days before next visit  It is important that you exercise regularly at least 30 minutes 5 times a week. If you develop chest pain, have severe difficulty breathing, or feel very tired, stop exercising immediately and seek medical attention   Think about what you will eat, plan ahead. Choose " clean, green, fresh or frozen" over canned, processed or packaged foods which are more sugary, salty and fatty. 70 to 75% of food eaten should be vegetables and fruit. Three meals at set times with snacks allowed between meals, but they must be fruit or vegetables. Aim to eat over a 12 hour period , example 7 am to 7 pm, and STOP after  your last meal of the day. Drink water,generally about 64 ounces per day, no other drink is as healthy. Fruit juice is best enjoyed in a healthy way, by EATING the fruit. Thanks for choosing [facility], we consider it a privelige to serve you.

## 2023-04-07 NOTE — Progress Notes (Signed)
Evan Black     MRN: [id number]      DOB: Dec 01, 1970  Chief Complaint  Patient presents with   Annual Exam    CPE acid reflux, nipples sore     HPI: Patient is in for annual physical exam. Immunization is reviewed , and  needs Covid vaccine Has not been taking prescribed bP meds unintentionally, and bP is extremely high    PE; BP (!) 178/118 (BP Location: Left Arm, Patient Position: Sitting, Cuff Size: Large)   Pulse (!) 101   Ht 5\' 10"  (1.778 m)   Wt 220 lb 1.9 oz (99.8 kg)   SpO2 98%   BMI 31.58 kg/m   Pleasant male, alert and oriented x 3, HEENT No facial trauma or asymetry.EOMI External ears normal,  Neck: supple, no adenopathy,JVD or thyromegaly.No bruits.  Chest: Clear to ascultation bilaterally.No crackles or wheezes. Non tender to palpation  Cardiovascular system; Heart sounds normal,  S1 and  S2 ,no S3. Peripheral pulses normal.  Abdomen: Soft, non tender, distended    Musculoskeletal exam: Decreased ROM of  lumbar spine, adequate in hips , shoulders and knees. No deformity ,swelling or crepitus noted. No muscle wasting or atrophy.   Neurologic: Cranial nerves 2 to 12 intact. Power, tone ,sensations normal throughout. No disturbance in gait. No tremor.  Skin: Intact, no ulceration, erythema , scaling or rash noted. Pigmentation normal throughout  Psych; Normal mood and affect. Judgement and concentration normal   Assessment & Plan:  Annual visit for general adult medical examination with abnormal findings Annual exam as documented. Counseling done  re healthy lifestyle involving commitment to 150 minutes exercise per week, heart healthy diet, and attaining healthy weight.The importance of adequate sleep also discussed. Immunization and cancer screening needs are specifically addressed at this visit.   Essential hypertension Uncontrolled New is azor 10/40 one daily and hydrochlorothiazide 12.5 mg daily Re eval in 4 months DASH diet  and commitment to daily physical activity for a minimum of 30 minutes discussed and encouraged, as a part of hypertension management. The importance of attaining a healthy weight is also discussed.     04/07/2023    4:15 PM 04/07/2023    4:13 PM 10/17/2022    4:32 PM 10/17/2022    4:01 PM 10/17/2022    4:00 PM 08/21/2022    4:29 PM 08/21/2022    4:26 PM  BP/Weight  Systolic BP 178 189 140 143 154 145 155  Diastolic BP 118 112 90 91 90 86 96  Wt. (Lbs)  220.12   223.12  226.12  BMI  31.58 kg/m2   32.01 kg/m2  32.44 kg/m2       Back pain Increased , recently had epidural with some relief  Hyperlipidemia with target LDL less than 100 Hyperlipidemia:Low fat diet discussed and encouraged.   Lipid Panel  Lab Results  Component Value Date   CHOL 141 10/15/2022   HDL 38 (L) 10/15/2022   LDLCALC 83 10/15/2022   LDLDIRECT 86 06/01/2014   TRIG 109 10/15/2022   CHOLHDL 3.7 10/15/2022     Updated lab needed at/ before next visit.   Obesity (BMI 30.0-34.9)  Patient re-educated about  the importance of commitment to a  minimum of 150 minutes of exercise per week as able.  The importance of healthy food choices with portion control discussed, as well as eating regularly and within a 12 hour window most days. The need to choose "clean , green" food 50 to 75% of the  time is discussed, as well as to make water the primary drink and set a goal of 64 ounces water daily.       04/07/2023    4:13 PM 10/17/2022    4:00 PM 08/21/2022    4:26 PM  Weight /BMI  Weight 220 lb 1.9 oz 223 lb 1.9 oz 226 lb 1.9 oz  Height 5\' 10"  (1.778 m) 5\' 10"  (1.778 m) 5\' 10"  (1.778 m)  BMI 31.58 kg/m2 32.01 kg/m2 32.44 kg/m2      Prediabetes Patient educated about the importance of limiting  Carbohydrate intake , the need to commit to daily physical activity for a minimum of 30 minutes , and to commit weight loss. The fact that changes in all these areas will reduce or eliminate all together the  development of diabetes is stressed.      Latest Ref Rng & Units 10/15/2022   11:17 AM 05/08/2022   10:07 AM 10/09/2021    4:19 PM 04/24/2020    3:21 PM 04/24/2020    2:45 PM  Diabetic Labs  HbA1c 4.8 - 5.6 % 6.4  6.2  6.0  6.1    Chol 100 - 199 mg/dL 086  578  469     HDL >62 mg/dL 38  32  37     Calc LDL 0 - 99 mg/dL 83  952  84     Triglycerides 0 - 149 mg/dL 841  324  401     Creatinine 0.76 - 1.27 mg/dL 0.27  2.53  6.64   4.03       04/07/2023    4:15 PM 04/07/2023    4:13 PM 10/17/2022    4:32 PM 10/17/2022    4:01 PM 10/17/2022    4:00 PM 08/21/2022    4:29 PM 08/21/2022    4:26 PM  BP/Weight  Systolic BP 178 189 140 143 154 145 155  Diastolic BP 118 112 90 91 90 86 96  Wt. (Lbs)  220.12   223.12  226.12  BMI  31.58 kg/m2   32.01 kg/m2  32.44 kg/m2       No data to display          Updated lab needed at/ before next visit.

## 2023-04-07 NOTE — Assessment & Plan Note (Signed)
  Patient re-educated about  the importance of commitment to a  minimum of 150 minutes of exercise per week as able.  The importance of healthy food choices with portion control discussed, as well as eating regularly and within a 12 hour window most days. The need to choose "clean , green" food 50 to 75% of the time is discussed, as well as to make water the primary drink and set a goal of 64 ounces water daily.       04/07/2023    4:13 PM 10/17/2022    4:00 PM 08/21/2022    4:26 PM  Weight /BMI  Weight 220 lb 1.9 oz 223 lb 1.9 oz 226 lb 1.9 oz  Height 5\' 10"  (1.778 m) 5\' 10"  (1.778 m) 5\' 10"  (1.778 m)  BMI 31.58 kg/m2 32.01 kg/m2 32.44 kg/m2

## 2023-04-07 NOTE — Assessment & Plan Note (Signed)
Patient educated about the importance of limiting  Carbohydrate intake , the need to commit to daily physical activity for a minimum of 30 minutes , and to commit weight loss. The fact that changes in all these areas will reduce or eliminate all together the development of diabetes is stressed.      Latest Ref Rng & Units 10/15/2022   11:17 AM 05/08/2022   10:07 AM 10/09/2021    4:19 PM 04/24/2020    3:21 PM 04/24/2020    2:45 PM  Diabetic Labs  HbA1c 4.8 - 5.6 % 6.4  6.2  6.0  6.1    Chol 100 - 199 mg/dL 161  096  045     HDL >40 mg/dL 38  32  37     Calc LDL 0 - 99 mg/dL 83  981  84     Triglycerides 0 - 149 mg/dL 191  478  295     Creatinine 0.76 - 1.27 mg/dL 6.21  3.08  6.57   8.46       04/07/2023    4:15 PM 04/07/2023    4:13 PM 10/17/2022    4:32 PM 10/17/2022    4:01 PM 10/17/2022    4:00 PM 08/21/2022    4:29 PM 08/21/2022    4:26 PM  BP/Weight  Systolic BP 178 189 140 143 154 145 155  Diastolic BP 118 112 90 91 90 86 96  Wt. (Lbs)  220.12   223.12  226.12  BMI  31.58 kg/m2   32.01 kg/m2  32.44 kg/m2       No data to display          Updated lab needed at/ before next visit.

## 2023-04-07 NOTE — Assessment & Plan Note (Signed)
Annual exam as documented. Counseling done  re healthy lifestyle involving commitment to 150 minutes exercise per week, heart healthy diet, and attaining healthy weight.The importance of adequate sleep also discussed.  Immunization and cancer screening needs are specifically addressed at this visit.  

## 2023-04-09 ENCOUNTER — Encounter: Payer: Self-pay | Admitting: Family Medicine

## 2023-04-09 ENCOUNTER — Other Ambulatory Visit: Payer: Self-pay

## 2023-04-09 MED ORDER — ATORVASTATIN CALCIUM 20 MG PO TABS: 20.0000 mg | ORAL_TABLET | Freq: Every day | ORAL | 1 refills | Status: DC

## 2023-04-28 ENCOUNTER — Other Ambulatory Visit: Payer: Self-pay | Admitting: Family Medicine

## 2023-04-28 MED ORDER — TRAMADOL HCL 50 MG PO TABS: ORAL_TABLET | ORAL | 0 refills | Status: AC

## 2023-05-06 ENCOUNTER — Ambulatory Visit: Payer: BC Managed Care – PPO | Admitting: Family Medicine

## 2023-05-29 ENCOUNTER — Other Ambulatory Visit: Payer: Self-pay | Admitting: Family Medicine

## 2023-05-31 ENCOUNTER — Emergency Department (HOSPITAL_COMMUNITY): Payer: BC Managed Care – PPO

## 2023-05-31 ENCOUNTER — Other Ambulatory Visit: Payer: Self-pay

## 2023-05-31 ENCOUNTER — Encounter (HOSPITAL_COMMUNITY): Payer: Self-pay | Admitting: *Deleted

## 2023-05-31 ENCOUNTER — Emergency Department (HOSPITAL_COMMUNITY)
Admission: EM | Admit: 2023-05-31 | Discharge: 2023-05-31 | Disposition: A | Discharge status: Home / Self Care | Payer: BC Managed Care – PPO | Attending: Emergency Medicine | Admitting: Emergency Medicine

## 2023-05-31 DIAGNOSIS — M545 Low back pain, unspecified: Secondary | ICD-10-CM | POA: Diagnosis present

## 2023-05-31 DIAGNOSIS — M7918 Myalgia, other site: Secondary | ICD-10-CM | POA: Insufficient documentation

## 2023-05-31 DIAGNOSIS — Y92410 Unspecified street and highway as the place of occurrence of the external cause: Secondary | ICD-10-CM | POA: Diagnosis not present

## 2023-05-31 MED ORDER — IBUPROFEN 800 MG PO TABS
800.0000 mg | ORAL_TABLET | Freq: Once | ORAL | Status: AC
  Administered 2023-05-31: 800 mg via ORAL
  Filled 2023-05-31: qty 1
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
## 2023-06-04 ENCOUNTER — Ambulatory Visit: Payer: BC Managed Care – PPO | Admitting: Family Medicine

## 2023-07-29 ENCOUNTER — Other Ambulatory Visit: Payer: Self-pay

## 2023-07-29 MED ORDER — PANTOPRAZOLE SODIUM 40 MG PO TBEC: 40.0000 mg | DELAYED_RELEASE_TABLET | Freq: Every day | ORAL | 2 refills | Status: DC

## 2023-07-29 MED ORDER — ATORVASTATIN CALCIUM 20 MG PO TABS: 20.0000 mg | ORAL_TABLET | Freq: Every day | ORAL | 2 refills | Status: AC

## 2023-07-29 MED ORDER — FAMOTIDINE 40 MG PO TABS: 40.0000 mg | ORAL_TABLET | Freq: Every day | ORAL | 2 refills | Status: DC

## 2023-07-29 MED ORDER — HYDROCHLOROTHIAZIDE 12.5 MG PO CAPS: 12.5000 mg | ORAL_CAPSULE | Freq: Every day | ORAL | 2 refills | Status: DC

## 2023-07-29 MED ORDER — AMLODIPINE-OLMESARTAN 10-40 MG PO TABS: 1.0000 | ORAL_TABLET | Freq: Every day | ORAL | 2 refills | Status: DC

## 2023-07-29 MED ORDER — CYCLOBENZAPRINE HCL 10 MG PO TABS: ORAL_TABLET | ORAL | 2 refills | Status: DC

## 2023-08-05 ENCOUNTER — Ambulatory Visit: Payer: BC Managed Care – PPO | Admitting: Family Medicine

## 2023-08-05 ENCOUNTER — Encounter: Payer: Self-pay | Admitting: Family Medicine

## 2023-08-05 VITALS — BP 141/86 | HR 89 | Resp 16 | Ht 70.0 in | Wt 229.0 lb

## 2023-08-05 DIAGNOSIS — E66811 Obesity, class 1: Secondary | ICD-10-CM

## 2023-08-05 DIAGNOSIS — Z125 Encounter for screening for malignant neoplasm of prostate: Secondary | ICD-10-CM

## 2023-08-05 DIAGNOSIS — E785 Hyperlipidemia, unspecified: Secondary | ICD-10-CM

## 2023-08-05 DIAGNOSIS — Z13220 Encounter for screening for lipoid disorders: Secondary | ICD-10-CM

## 2023-08-05 DIAGNOSIS — M25521 Pain in right elbow: Secondary | ICD-10-CM | POA: Diagnosis not present

## 2023-08-05 DIAGNOSIS — R7303 Prediabetes: Secondary | ICD-10-CM | POA: Diagnosis not present

## 2023-08-05 DIAGNOSIS — I10 Essential (primary) hypertension: Secondary | ICD-10-CM

## 2023-08-05 LAB — CMP14+EGFR
ALT: 38 IU/L (ref 0–44)
AST: 31 IU/L (ref 0–40)
Albumin: 4.1 g/dL (ref 3.8–4.9)
Alkaline Phosphatase: 73 IU/L (ref 44–121)
BUN/Creatinine Ratio: 9 (ref 9–20)
BUN: 8 mg/dL (ref 6–24)
Bilirubin Total: 0.2 mg/dL (ref 0.0–1.2)
CO2: 24 mmol/L (ref 20–29)
Calcium: 9.6 mg/dL (ref 8.7–10.2)
Chloride: 103 mmol/L (ref 96–106)
Creatinine, Ser: 0.9 mg/dL (ref 0.76–1.27)
Globulin, Total: 2.5 g/dL (ref 1.5–4.5)
Glucose: 96 mg/dL (ref 70–99)
Potassium: 3.8 mmol/L (ref 3.5–5.2)
Sodium: 142 mmol/L (ref 134–144)
Total Protein: 6.6 g/dL (ref 6.0–8.5)
eGFR: 101 mL/min/{1.73_m2} (ref >59)

## 2023-08-05 LAB — LIPID PANEL
Chol/HDL Ratio: 5.3 ratio — ABNORMAL HIGH (ref 0.0–5.0)
Cholesterol, Total: 149 mg/dL (ref 100–199)
HDL: 28 mg/dL — ABNORMAL LOW (ref >39)
LDL Chol Calc (NIH): 74 mg/dL (ref 0–99)
Triglycerides: 292 mg/dL — ABNORMAL HIGH (ref 0–149)
VLDL Cholesterol Cal: 47 mg/dL — ABNORMAL HIGH (ref 5–40)

## 2023-08-05 LAB — HEMOGLOBIN A1C
Est. average glucose Bld gHb Est-mCnc: 120 mg/dL
Hgb A1c MFr Bld: 5.8 % — ABNORMAL HIGH (ref 4.8–5.6)

## 2023-08-05 MED ORDER — PHENTERMINE HCL 37.5 MG PO TABS
ORAL_TABLET | ORAL | 1 refills | Status: DC
Start: 2023-08-05 — End: 2023-12-22
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
## 2023-08-05 NOTE — Patient Instructions (Addendum)
 F/u in 14 week, call if you need me sooner  Call Ortho re right elbow pain and swelling  Think about what you will eat, plan ahead. Choose " clean, green, fresh or frozen" over canned, processed or packaged foods which are more sugary, salty and fatty. 70 to 75% of food eaten should be vegetables and fruit. Three meals at set times with snacks allowed between meals, but they must be fruit or vegetables. Aim to eat over a 12 hour period , example 7 am to 7 pm, and STOP after  your last meal of the day. Drink water,generally about 64 ounces per day, no other drink is as healthy. Fruit juice is best enjoyed in a healthy way, by EATING the fruit.  Weight loss goal of 10 pounds Lab add on uric acid level to lab drawn yesterday  Lab add on uric acid ( ordered) New is phentermine HALF tablet daily , change food choice  Nurse pls order fasting lipid, cmp and EGFr, CBC, TSH , PSA to be done 3 to 5 days before next appt  Thanks for choosing [facility], we consider it a privelige to serve you.

## 2023-08-08 LAB — URIC ACID: Uric Acid: 7.5 mg/dL (ref 3.8–8.4)

## 2023-08-08 LAB — SPECIMEN STATUS REPORT

## 2023-08-09 ENCOUNTER — Encounter: Payer: Self-pay | Admitting: Family Medicine

## 2023-08-12 ENCOUNTER — Encounter: Payer: Self-pay | Admitting: Family Medicine

## 2023-08-12 DIAGNOSIS — M25521 Pain in right elbow: Secondary | ICD-10-CM | POA: Insufficient documentation

## 2023-08-12 DIAGNOSIS — Z125 Encounter for screening for malignant neoplasm of prostate: Secondary | ICD-10-CM | POA: Insufficient documentation

## 2023-08-12 NOTE — Assessment & Plan Note (Signed)
 Hyperlipidemia:Low fat diet discussed and encouraged.   Lipid Panel  Lab Results  Component Value Date   CHOL 149 08/04/2023   HDL 28 (L) 08/04/2023   LDLCALC 74 08/04/2023   LDLDIRECT 86 06/01/2014   TRIG 292 (H) 08/04/2023   CHOLHDL 5.3 (H) 08/04/2023     Needs to reduce fried and fatty foods , needs meds due to incCAD risk

## 2023-08-12 NOTE — Assessment & Plan Note (Signed)
 Elevated at visit however significant weight gain, no med change DASH diet and commitment to daily physical activity for a minimum of 30 minutes discussed and encouraged, as a part of hypertension management. The importance of attaining a healthy weight is also discussed.     08/05/2023    2:34 PM 05/31/2023    5:19 PM 05/31/2023    5:17 PM 04/07/2023    4:15 PM 04/07/2023    4:13 PM 10/17/2022    4:32 PM 10/17/2022    4:01 PM  BP/Weight  Systolic BP 141 135  178 189 140 143  Diastolic BP 86 93  118 112 90 91  Wt. (Lbs) 229  223  220.12    BMI 32.86 kg/m2  32 kg/m2  31.58 kg/m2

## 2023-08-12 NOTE — Assessment & Plan Note (Signed)
 1 week h/o pain and swelling Orhto to eval and manage, pt will call for his appt with [facility]

## 2023-08-12 NOTE — Assessment & Plan Note (Signed)
  Patient re-educated about  the importance of commitment to a  minimum of 150 minutes of exercise per week as able.  The importance of healthy food choices with portion control discussed, as well as eating regularly and within a 12 hour window most days. The need to choose "clean , green" food 50 to 75% of the time is discussed, as well as to make water the primary drink and set a goal of 64 ounces water daily.       08/05/2023    2:34 PM 05/31/2023    5:17 PM 04/07/2023    4:13 PM  Weight /BMI  Weight 229 lb 223 lb 220 lb 1.9 oz  Height 5\' 10"  (1.778 m) 5\' 10"  (1.778 m) 5\' 10"  (1.778 m)  BMI 32.86 kg/m2 32 kg/m2 31.58 kg/m2    Deteriorated , start half phentermine daily and change food choice

## 2023-08-12 NOTE — Assessment & Plan Note (Signed)
 Patient educated about the importance of limiting  Carbohydrate intake , the need to commit to daily physical activity for a minimum of 30 minutes , and to commit weight loss. The fact that changes in all these areas will reduce or eliminate all together the development of diabetes is stressed.      Latest Ref Rng & Units 08/04/2023    8:39 AM 10/15/2022   11:17 AM 05/08/2022   10:07 AM 10/09/2021    4:19 PM 04/24/2020    3:21 PM  Diabetic Labs  HbA1c 4.8 - 5.6 % 5.8  6.4  6.2  6.0  6.1   Chol 100 - 199 mg/dL 147  829  562  130    HDL >39 mg/dL 28  38  32  37    Calc LDL 0 - 99 mg/dL 74  83  865  84    Triglycerides 0 - 149 mg/dL 784  696  295  284    Creatinine 0.76 - 1.27 mg/dL 1.32  4.40  1.02  7.25        08/05/2023    2:34 PM 05/31/2023    5:19 PM 05/31/2023    5:17 PM 04/07/2023    4:15 PM 04/07/2023    4:13 PM 10/17/2022    4:32 PM 10/17/2022    4:01 PM  BP/Weight  Systolic BP 141 135  178 189 140 143  Diastolic BP 86 93  118 112 90 91  Wt. (Lbs) 229  223  220.12    BMI 32.86 kg/m2  32 kg/m2  31.58 kg/m2         No data to display          improved

## 2023-08-16 ENCOUNTER — Other Ambulatory Visit: Payer: Self-pay | Admitting: Family Medicine

## 2023-08-19 ENCOUNTER — Encounter: Payer: Self-pay | Admitting: Family Medicine

## 2023-08-19 ENCOUNTER — Ambulatory Visit: Admitting: Family Medicine

## 2023-08-19 ENCOUNTER — Other Ambulatory Visit: Payer: Self-pay | Admitting: Family Medicine

## 2023-08-19 ENCOUNTER — Ambulatory Visit (HOSPITAL_COMMUNITY)
Admission: RE | Admit: 2023-08-19 | Discharge: 2023-08-19 | Disposition: A | Discharge status: Home / Self Care | Source: Ambulatory Visit | Attending: Family Medicine | Admitting: Family Medicine

## 2023-08-19 VITALS — BP 132/78 | HR 82 | Temp 98.2°F | Resp 16 | Ht 70.0 in | Wt 224.8 lb

## 2023-08-19 DIAGNOSIS — R051 Acute cough: Secondary | ICD-10-CM

## 2023-08-19 DIAGNOSIS — J209 Acute bronchitis, unspecified: Secondary | ICD-10-CM

## 2023-08-19 DIAGNOSIS — J0110 Acute frontal sinusitis, unspecified: Secondary | ICD-10-CM

## 2023-08-19 DIAGNOSIS — I10 Essential (primary) hypertension: Secondary | ICD-10-CM

## 2023-08-19 DIAGNOSIS — J309 Allergic rhinitis, unspecified: Secondary | ICD-10-CM

## 2023-08-19 MED ORDER — MONTELUKAST SODIUM 10 MG PO TABS: 10.0000 mg | ORAL_TABLET | Freq: Every day | ORAL | 5 refills | Status: AC

## 2023-08-19 MED ORDER — BENZONATATE 200 MG PO CAPS: 200.0000 mg | ORAL_CAPSULE | Freq: Two times a day (BID) | ORAL | 0 refills | Status: DC | PRN

## 2023-08-19 MED ORDER — AZITHROMYCIN 250 MG PO TABS: ORAL_TABLET | ORAL | 0 refills | Status: AC

## 2023-08-19 NOTE — Progress Notes (Signed)
   Evan Black     MRN: [id number]      DOB: [birth year]  Chief Complaint  Patient presents with   Cough    Started Monday with cough and sneezing and sinus congestion. Coughing up yellow thick mucus, body aches. Unsure if ever had fever     HPI Evan Black is here with  above complaints/ concerns ROS See HPI  C/o intermittent chills , and  fever, generalized malaise , feels as though he has been run over by a ton of bricks Denies abdominal pain, nausea, vomiting,diarrhea or constipation.   Denies dysuria, frequency, hesitancy or incontinence. Denies joint pain, swelling and limitation in mobility. Denies headaches, seizures, numbness, or tingling. Denies depression, anxiety or insomnia. Denies skin break down or rash.   PE  BP 132/78   Pulse 82   Temp 98.2 F (36.8 C) (Oral)   Resp 16   Ht 5\' 10"  (1.778 m)   Wt 224 lb 12.8 oz (102 kg)   SpO2 95%   BMI 32.26 kg/m   Patient alert and oriented acutely ill appearing HEENT: No facial asymmetry, EOMI,     Neck supple .Frontal sinus tenderness, nasal congestion, cervical adenopathy Decreased air entry bilateral crackles and wheezes CVS: S1, S2 no murmurs, no S3.Regular rate.  ABD: Soft non tender.   Ext: No edema  MS: Adequate ROM spine, shoulders, hips and knees.  Skin: Intact, no ulcerations or rash noted.  Psych: Good eye contact, normal affect. Memory intact not anxious or depressed appearing.  CNS: CN 2-12 intact, power,  normal throughout.no focal deficits noted.   Assessment & Plan  Acute cough cXR , viral titers , fluid , est , work note   Acute bronchitis Z pack, tessalon perles  Acute sinusitis Z pack, fluids and rest  Allergic rhinitis Uncontrolled, no  compliant, commit to daily Singulair as prescribed

## 2023-08-19 NOTE — Patient Instructions (Addendum)
 F/u as before, call if you ned me sooner  Flu, covid and RSV swab today, results will be sent to you  CXR today  Work excuse for today, return 08/22/2023  Azithromycin and tessalon perls are prescribed, take as prescribed , the entire course  You DO NEED to stay on allergy medication montelukast every day , prescription is sent  You are treated for acute sinusitis and bronchitis  Thanks for choosing [facility], we consider it a privelige to serve you.

## 2023-08-20 ENCOUNTER — Encounter: Payer: Self-pay | Admitting: Family Medicine

## 2023-08-22 LAB — COVID-19, FLU A+B AND RSV
Influenza A, NAA: DETECTED — AB
Influenza B, NAA: NOT DETECTED
RSV, NAA: NOT DETECTED
SARS-CoV-2, NAA: NOT DETECTED

## 2023-08-23 ENCOUNTER — Encounter: Payer: Self-pay | Admitting: Family Medicine

## 2023-08-23 MED ORDER — MECLIZINE HCL 12.5 MG PO TABS: 12.5000 mg | ORAL_TABLET | Freq: Three times a day (TID) | ORAL | 0 refills | Status: DC | PRN

## 2023-08-23 MED ORDER — OSELTAMIVIR PHOSPHATE 75 MG PO CAPS
75.0000 mg | ORAL_CAPSULE | Freq: Two times a day (BID) | ORAL | 0 refills | Status: AC
Start: 2023-08-23 — End: 2023-08-28

## 2023-08-25 ENCOUNTER — Encounter: Payer: Self-pay | Admitting: Family Medicine

## 2023-09-18 ENCOUNTER — Encounter: Payer: Self-pay | Admitting: Family Medicine

## 2023-09-18 DIAGNOSIS — R051 Acute cough: Secondary | ICD-10-CM | POA: Insufficient documentation

## 2023-09-18 DIAGNOSIS — J019 Acute sinusitis, unspecified: Secondary | ICD-10-CM | POA: Insufficient documentation

## 2023-09-18 NOTE — Assessment & Plan Note (Signed)
 Uncontrolled, no  compliant, commit to daily Singulair as prescribed

## 2023-09-18 NOTE — Assessment & Plan Note (Signed)
 Z pack, tessalon perles

## 2023-09-18 NOTE — Assessment & Plan Note (Signed)
 Z pack, fluids and rest

## 2023-09-18 NOTE — Assessment & Plan Note (Signed)
 cXR , viral titers , fluid , est , work note

## 2023-09-18 NOTE — Assessment & Plan Note (Signed)
 Controlled, no change in medication

## 2023-09-22 ENCOUNTER — Other Ambulatory Visit (HOSPITAL_COMMUNITY): Payer: Self-pay

## 2023-10-16 ENCOUNTER — Other Ambulatory Visit: Payer: Self-pay | Admitting: Family Medicine

## 2023-10-16 DIAGNOSIS — E66811 Obesity, class 1: Secondary | ICD-10-CM

## 2023-11-11 ENCOUNTER — Ambulatory Visit: Payer: BC Managed Care – PPO | Admitting: Family Medicine

## 2023-12-07 ENCOUNTER — Other Ambulatory Visit (HOSPITAL_COMMUNITY): Payer: Self-pay

## 2023-12-22 ENCOUNTER — Encounter: Payer: Self-pay | Admitting: Family Medicine

## 2023-12-22 ENCOUNTER — Ambulatory Visit: Admitting: Family Medicine

## 2023-12-22 VITALS — BP 126/78 | HR 71 | Resp 18 | Ht 70.0 in | Wt 219.1 lb

## 2023-12-22 DIAGNOSIS — E66811 Obesity, class 1: Secondary | ICD-10-CM

## 2023-12-22 DIAGNOSIS — B353 Tinea pedis: Secondary | ICD-10-CM

## 2023-12-22 DIAGNOSIS — Z23 Encounter for immunization: Secondary | ICD-10-CM | POA: Diagnosis not present

## 2023-12-22 DIAGNOSIS — E785 Hyperlipidemia, unspecified: Secondary | ICD-10-CM

## 2023-12-22 DIAGNOSIS — R7303 Prediabetes: Secondary | ICD-10-CM | POA: Diagnosis not present

## 2023-12-22 DIAGNOSIS — I10 Essential (primary) hypertension: Secondary | ICD-10-CM

## 2023-12-22 MED ORDER — PHENTERMINE HCL 37.5 MG PO TABS: 37.5000 mg | ORAL_TABLET | Freq: Every day | ORAL | 3 refills | Status: AC

## 2023-12-22 MED ORDER — CLOTRIMAZOLE-BETAMETHASONE 1-0.05 % EX CREA: 1.0000 | TOPICAL_CREAM | Freq: Two times a day (BID) | CUTANEOUS | 2 refills | Status: AC

## 2023-12-22 NOTE — Progress Notes (Unsigned)
 Evan Black     MRN: [id number]      DOB: 11-14-1970  Chief Complaint  Patient presents with   Hypertension    14 week follow up. Is wanting to get back on phentermine      HPI Evan Black is here for follow up and re-evaluation of chronic medical conditions, medication management and review of any available recent lab and radiology data.  Preventive health is updated, specifically  Cancer screening and Immunization.   The PT denies any adverse reactions to current medications since the last visit.  There are no new concerns.  There are no specific complaints   ROS Denies recent fever or chills. Denies sinus pressure, nasal congestion, ear pain or sore throat. Denies chest congestion, productive cough or wheezing. Denies chest pains, palpitations and leg swelling Denies abdominal pain, nausea, vomiting,diarrhea or constipation.   Denies dysuria, frequency, hesitancy or incontinence. Denies uncontrolled  joint pain, swelling and limitation in mobility. Denies headaches, seizures, numbness, or tingling. Denies depression, anxiety or insomnia. Denies skin break down or rash.   PE  BP 126/78   Pulse 71   Resp 18   Ht 5' 10 (1.778 m)   Wt 219 lb 1.9 oz (99.4 kg)   SpO2 95%   BMI 31.44 kg/m   Patient alert and oriented and in no cardiopulmonary distress.  HEENT: No facial asymmetry, EOMI,     Neck supple .  Chest: Clear to auscultation bilaterally.  CVS: S1, S2 no murmurs, no S3.Regular rate.  ABD: Soft non tender  Ext: No edema  MS: Adequate ROM spine, shoulders, hips and knees.  Skin: Intact, mild tinea pedis bilaterally  Psych: Good eye contact, normal affect. Memory intact not anxious or depressed appearing.  CNS: CN 2-12 intact, power,  normal throughout.no focal deficits noted.   Assessment & Plan  Encounter for immunization After obtaining informed consent, the Hep B#1  vaccine is  administered , with no adverse effect noted at the time of  administration.   Essential hypertension Controlled, no change in medication DASH diet and commitment to daily physical activity for a minimum of 30 minutes discussed and encouraged, as a part of hypertension management. The importance of attaining a healthy weight is also discussed.     12/22/2023    8:43 AM 08/19/2023    1:54 PM 08/05/2023    2:34 PM 05/31/2023    5:19 PM 05/31/2023    5:17 PM 04/07/2023    4:15 PM 04/07/2023    4:13 PM  BP/Weight  Systolic BP 126 132 141 135  178 189  Diastolic BP 78 78 86 93  118 112  Wt. (Lbs) 219.12 224.8 229  223  220.12  BMI 31.44 kg/m2 32.26 kg/m2 32.86 kg/m2  32 kg/m2  31.58 kg/m2       Hyperlipidemia with target LDL less than 100 Hyperlipidemia:Low fat diet discussed and encouraged.   Lipid Panel  Lab Results  Component Value Date   CHOL 149 08/04/2023   HDL 28 (L) 08/04/2023   LDLCALC 74 08/04/2023   LDLDIRECT 86 06/01/2014   TRIG 292 (H) 08/04/2023   CHOLHDL 5.3 (H) 08/04/2023     Needs to reduce fried and fatty foods  Updated lab needed at/ before next visit.   Obesity (BMI 30.0-34.9)  Patient re-educated about  the importance of commitment to a  minimum of 150 minutes of exercise per week as able.  The importance of healthy food choices with portion control discussed, as well  as eating regularly and within a 12 hour window most days. The need to choose clean , green food 50 to 75% of the time is discussed, as well as to make water the primary drink and set a goal of 64 ounces water daily.       12/22/2023    8:43 AM 08/19/2023    1:54 PM 08/05/2023    2:34 PM  Weight /BMI  Weight 219 lb 1.9 oz 224 lb 12.8 oz 229 lb  Height 5' 10 (1.778 m) 5' 10 (1.778 m) 5' 10 (1.778 m)  BMI 31.44 kg/m2 32.26 kg/m2 32.86 kg/m2    Improved, phentermine   x 4 months prescribed  Prediabetes Patient educated about the importance of limiting  Carbohydrate intake , the need to commit to daily physical activity for a minimum  of 30 minutes , and to commit weight loss. The fact that changes in all these areas will reduce or eliminate all together the development of diabetes is stressed.      Latest Ref Rng & Units 08/04/2023    8:39 AM 10/15/2022   11:17 AM 05/08/2022   10:07 AM 10/09/2021    4:19 PM 04/24/2020    3:21 PM  Diabetic Labs  HbA1c 4.8 - 5.6 % 5.8  6.4  6.2  6.0  6.1   Chol 100 - 199 mg/dL 850  858  831  844    HDL >39 mg/dL 28  38  32  37    Calc LDL 0 - 99 mg/dL 74  83  895  84    Triglycerides 0 - 149 mg/dL 707  890  815  801    Creatinine 0.76 - 1.27 mg/dL 9.09  9.14  8.98  9.20        12/22/2023    8:43 AM 08/19/2023    1:54 PM 08/05/2023    2:34 PM 05/31/2023    5:19 PM 05/31/2023    5:17 PM 04/07/2023    4:15 PM 04/07/2023    4:13 PM  BP/Weight  Systolic BP 126 132 141 135  178 189  Diastolic BP 78 78 86 93  118 112  Wt. (Lbs) 219.12 224.8 229  223  220.12  BMI 31.44 kg/m2 32.26 kg/m2 32.86 kg/m2  32 kg/m2  31.58 kg/m2       No data to display            Tinea pedis Improved greatly, clotrimazole / betamethasone  topically as needed

## 2023-12-22 NOTE — Patient Instructions (Addendum)
 Annual exam in 16 weeks, call if you need me sooner.  Hepatitis B vaccine #1 today.  Nurse visit for hep B #2 in 8 weeks and hepatitis B #3 in 6 months.  Please get fasting labs ordered in February today prior to checkout.  Keep up good health habits of regular exercise and cutting back on sweets and starches.  Work on sodas.    Antifungal cream is sent in to use on your feet as needed.  Thanks for choosing [facility], we consider it a privelige to serve you.

## 2023-12-23 ENCOUNTER — Encounter: Payer: Self-pay | Admitting: Family Medicine

## 2023-12-23 ENCOUNTER — Ambulatory Visit: Payer: Self-pay | Admitting: Family Medicine

## 2023-12-23 ENCOUNTER — Other Ambulatory Visit: Payer: Self-pay

## 2023-12-23 DIAGNOSIS — R61 Generalized hyperhidrosis: Secondary | ICD-10-CM

## 2023-12-23 DIAGNOSIS — E8881 Metabolic syndrome: Secondary | ICD-10-CM

## 2023-12-23 DIAGNOSIS — Z23 Encounter for immunization: Secondary | ICD-10-CM | POA: Diagnosis not present

## 2023-12-23 DIAGNOSIS — R42 Dizziness and giddiness: Secondary | ICD-10-CM

## 2023-12-23 DIAGNOSIS — Z87891 Personal history of nicotine dependence: Secondary | ICD-10-CM

## 2023-12-23 DIAGNOSIS — R7302 Impaired glucose tolerance (oral): Secondary | ICD-10-CM

## 2023-12-23 LAB — CMP14+EGFR
ALT: 34 IU/L (ref 0–44)
AST: 27 IU/L (ref 0–40)
Albumin: 4.2 g/dL (ref 3.8–4.9)
Alkaline Phosphatase: 67 IU/L (ref 44–121)
BUN/Creatinine Ratio: 13 (ref 9–20)
BUN: 15 mg/dL (ref 6–24)
Bilirubin Total: 0.4 mg/dL (ref 0.0–1.2)
CO2: 24 mmol/L (ref 20–29)
Calcium: 9.5 mg/dL (ref 8.7–10.2)
Chloride: 101 mmol/L (ref 96–106)
Creatinine, Ser: 1.14 mg/dL (ref 0.76–1.27)
Globulin, Total: 2.4 g/dL (ref 1.5–4.5)
Glucose: 108 mg/dL — ABNORMAL HIGH (ref 70–99)
Potassium: 4.2 mmol/L (ref 3.5–5.2)
Sodium: 143 mmol/L (ref 134–144)
Total Protein: 6.6 g/dL (ref 6.0–8.5)
eGFR: 76 mL/min/1.73 (ref >59)

## 2023-12-23 LAB — CBC WITH DIFFERENTIAL/PLATELET
Basophils Absolute: 0 x10E3/uL (ref 0.0–0.2)
Basos: 1 %
EOS (ABSOLUTE): 0.1 x10E3/uL (ref 0.0–0.4)
Eos: 1 %
Hematocrit: 41.7 % (ref 37.5–51.0)
Hemoglobin: 13.2 g/dL (ref 13.0–17.7)
Immature Grans (Abs): 0 x10E3/uL (ref 0.0–0.1)
Immature Granulocytes: 0 %
Lymphocytes Absolute: 3.1 x10E3/uL (ref 0.7–3.1)
Lymphs: 35 %
MCH: 28.8 pg (ref 26.6–33.0)
MCHC: 31.7 g/dL (ref 31.5–35.7)
MCV: 91 fL (ref 79–97)
Monocytes Absolute: 0.7 x10E3/uL (ref 0.1–0.9)
Monocytes: 8 %
Neutrophils Absolute: 4.8 x10E3/uL (ref 1.4–7.0)
Neutrophils: 55 %
Platelets: 302 x10E3/uL (ref 150–450)
RBC: 4.59 x10E6/uL (ref 4.14–5.80)
RDW: 14.5 % (ref 11.6–15.4)
WBC: 8.8 x10E3/uL (ref 3.4–10.8)

## 2023-12-23 LAB — LIPID PANEL
Chol/HDL Ratio: 5 ratio (ref 0.0–5.0)
Cholesterol, Total: 156 mg/dL (ref 100–199)
HDL: 31 mg/dL — ABNORMAL LOW (ref >39)
LDL Chol Calc (NIH): 89 mg/dL (ref 0–99)
Triglycerides: 213 mg/dL — ABNORMAL HIGH (ref 0–149)
VLDL Cholesterol Cal: 36 mg/dL (ref 5–40)

## 2023-12-23 LAB — PSA: Prostate Specific Ag, Serum: 0.4 ng/mL (ref 0.0–4.0)

## 2023-12-23 LAB — TSH: TSH: 1.09 u[IU]/mL (ref 0.450–4.500)

## 2023-12-23 MED ORDER — EZETIMIBE 10 MG PO TABS: 10.0000 mg | ORAL_TABLET | Freq: Every day | ORAL | 1 refills | Status: AC

## 2023-12-23 NOTE — Assessment & Plan Note (Signed)
 Improved greatly, clotrimazole / betamethasone  topically as needed

## 2023-12-23 NOTE — Assessment & Plan Note (Signed)
 Hyperlipidemia:Low fat diet discussed and encouraged.   Lipid Panel  Lab Results  Component Value Date   CHOL 149 08/04/2023   HDL 28 (L) 08/04/2023   LDLCALC 74 08/04/2023   LDLDIRECT 86 06/01/2014   TRIG 292 (H) 08/04/2023   CHOLHDL 5.3 (H) 08/04/2023     Needs to reduce fried and fatty foods  Updated lab needed at/ before next visit.

## 2023-12-23 NOTE — Addendum Note (Signed)
 Addended by: VICCI LAVANDA MATSU on: 12/23/2023 08:16 AM   Modules accepted: Orders

## 2023-12-23 NOTE — Assessment & Plan Note (Signed)
  Patient re-educated about  the importance of commitment to a  minimum of 150 minutes of exercise per week as able.  The importance of healthy food choices with portion control discussed, as well as eating regularly and within a 12 hour window most days. The need to choose clean , green food 50 to 75% of the time is discussed, as well as to make water the primary drink and set a goal of 64 ounces water daily.       12/22/2023    8:43 AM 08/19/2023    1:54 PM 08/05/2023    2:34 PM  Weight /BMI  Weight 219 lb 1.9 oz 224 lb 12.8 oz 229 lb  Height 5' 10 (1.778 m) 5' 10 (1.778 m) 5' 10 (1.778 m)  BMI 31.44 kg/m2 32.26 kg/m2 32.86 kg/m2    Improved, phentermine   x 4 months prescribed

## 2023-12-23 NOTE — Assessment & Plan Note (Signed)
 Patient educated about the importance of limiting  Carbohydrate intake , the need to commit to daily physical activity for a minimum of 30 minutes , and to commit weight loss. The fact that changes in all these areas will reduce or eliminate all together the development of diabetes is stressed.      Latest Ref Rng & Units 08/04/2023    8:39 AM 10/15/2022   11:17 AM 05/08/2022   10:07 AM 10/09/2021    4:19 PM 04/24/2020    3:21 PM  Diabetic Labs  HbA1c 4.8 - 5.6 % 5.8  6.4  6.2  6.0  6.1   Chol 100 - 199 mg/dL 850  858  831  844    HDL >39 mg/dL 28  38  32  37    Calc LDL 0 - 99 mg/dL 74  83  895  84    Triglycerides 0 - 149 mg/dL 707  890  815  801    Creatinine 0.76 - 1.27 mg/dL 9.09  9.14  8.98  9.20        12/22/2023    8:43 AM 08/19/2023    1:54 PM 08/05/2023    2:34 PM 05/31/2023    5:19 PM 05/31/2023    5:17 PM 04/07/2023    4:15 PM 04/07/2023    4:13 PM  BP/Weight  Systolic BP 126 132 141 135  178 189  Diastolic BP 78 78 86 93  118 112  Wt. (Lbs) 219.12 224.8 229  223  220.12  BMI 31.44 kg/m2 32.26 kg/m2 32.86 kg/m2  32 kg/m2  31.58 kg/m2       No data to display

## 2023-12-23 NOTE — Assessment & Plan Note (Signed)
 Controlled, no change in medication DASH diet and commitment to daily physical activity for a minimum of 30 minutes discussed and encouraged, as a part of hypertension management. The importance of attaining a healthy weight is also discussed.     12/22/2023    8:43 AM 08/19/2023    1:54 PM 08/05/2023    2:34 PM 05/31/2023    5:19 PM 05/31/2023    5:17 PM 04/07/2023    4:15 PM 04/07/2023    4:13 PM  BP/Weight  Systolic BP 126 132 141 135  178 189  Diastolic BP 78 78 86 93  118 112  Wt. (Lbs) 219.12 224.8 229  223  220.12  BMI 31.44 kg/m2 32.26 kg/m2 32.86 kg/m2  32 kg/m2  31.58 kg/m2

## 2023-12-23 NOTE — Assessment & Plan Note (Signed)
 After obtaining informed consent, the Hep B #1 vaccine is  administered , with no adverse effect noted at the time of administration.

## 2024-01-05 ENCOUNTER — Ambulatory Visit

## 2024-01-06 ENCOUNTER — Telehealth: Payer: Self-pay

## 2024-01-06 NOTE — Telephone Encounter (Signed)
 The scan is ordered , and I will request an asap appt for this

## 2024-01-06 NOTE — Telephone Encounter (Signed)
 Copied from CRM [id number]. Topic: Clinical - Medical Advice >> Jan 06, 2024  4:00 PM Nathanel BROCKS wrote: Reason for CRM: Pts wife Marval called and is concerned about husband. He has been going through some things lately and she wants to let the dr know about them and see what she needs to do. Please call her at [phone]-

## 2024-01-19 ENCOUNTER — Encounter: Payer: Self-pay | Admitting: Family Medicine

## 2024-01-19 ENCOUNTER — Ambulatory Visit: Payer: Self-pay | Admitting: Family Medicine

## 2024-01-19 VITALS — BP 127/80 | HR 87 | Resp 16 | Ht 70.0 in | Wt 213.1 lb

## 2024-01-19 DIAGNOSIS — R42 Dizziness and giddiness: Secondary | ICD-10-CM | POA: Diagnosis not present

## 2024-01-19 DIAGNOSIS — R5383 Other fatigue: Secondary | ICD-10-CM | POA: Insufficient documentation

## 2024-01-19 DIAGNOSIS — I10 Essential (primary) hypertension: Secondary | ICD-10-CM

## 2024-01-19 DIAGNOSIS — E66811 Obesity, class 1: Secondary | ICD-10-CM

## 2024-01-19 DIAGNOSIS — G8929 Other chronic pain: Secondary | ICD-10-CM | POA: Diagnosis not present

## 2024-01-19 DIAGNOSIS — M5441 Lumbago with sciatica, right side: Secondary | ICD-10-CM

## 2024-01-19 DIAGNOSIS — R7303 Prediabetes: Secondary | ICD-10-CM

## 2024-01-19 DIAGNOSIS — R61 Generalized hyperhidrosis: Secondary | ICD-10-CM | POA: Insufficient documentation

## 2024-01-19 DIAGNOSIS — M5442 Lumbago with sciatica, left side: Secondary | ICD-10-CM

## 2024-01-19 DIAGNOSIS — R079 Chest pain, unspecified: Secondary | ICD-10-CM | POA: Insufficient documentation

## 2024-01-19 NOTE — Patient Instructions (Addendum)
 F/U as before  Tesotsterone level and HBA1C today  EKG today is NORMAL   Think about what you will eat, plan ahead. Choose  clean, green, fresh or frozen over canned, processed or packaged foods which are more sugary, salty and fatty. 70 to 75% of food eaten should be vegetables and fruit. Three meals at set times with snacks allowed between meals, but they must be fruit or vegetables. Aim to eat over a 12 hour period , example 7 am to 7 pm, and STOP after  your last meal of the day. Drink water,generally about 64 ounces per day, no other drink is as healthy. Fruit juice is best enjoyed in a healthy way, by EATING the fruit. Thanks for choosing [facility], we consider it a privelige to serve you.

## 2024-01-19 NOTE — Assessment & Plan Note (Signed)
 Patient educated about the importance of limiting  Carbohydrate intake , the need to commit to daily physical activity for a minimum of 30 minutes , and to commit weight loss. The fact that changes in all these areas will reduce or eliminate all together the development of diabetes is stressed.      Latest Ref Rng & Units 12/22/2023    9:21 AM 08/04/2023    8:39 AM 10/15/2022   11:17 AM 05/08/2022   10:07 AM 10/09/2021    4:19 PM  Diabetic Labs  HbA1c 4.8 - 5.6 %  5.8  6.4  6.2  6.0   Chol 100 - 199 mg/dL 843  850  858  831  844   HDL >39 mg/dL 31  28  38  32  37   Calc LDL 0 - 99 mg/dL 89  74  83  895  84   Triglycerides 0 - 149 mg/dL 786  707  890  815  801   Creatinine 0.76 - 1.27 mg/dL 8.85  9.09  9.14  8.98  0.79       01/19/2024    3:39 PM 12/22/2023    8:43 AM 08/19/2023    1:54 PM 08/05/2023    2:34 PM 05/31/2023    5:19 PM 05/31/2023    5:17 PM 04/07/2023    4:15 PM  BP/Weight  Systolic BP 127 126 132 141 135  178  Diastolic BP 80 78 78 86 93  118  Wt. (Lbs) 213.12 219.12 224.8 229  223   BMI 30.58 kg/m2 31.44 kg/m2 32.26 kg/m2 32.86 kg/m2  32 kg/m2        No data to display          Updated lab needed at/ before next visit.

## 2024-01-19 NOTE — Assessment & Plan Note (Signed)
 Repoprts currently controlled

## 2024-01-19 NOTE — Assessment & Plan Note (Signed)
 Reduced exercise tolerance and episodes of daphoresis associated with light headedness EKG: NSR, no ischemia, no lVH NORMAL

## 2024-01-19 NOTE — Assessment & Plan Note (Addendum)
 Controlled, no change in medication DASH diet and commitment to daily physical activity for a minimum of 30 minutes discussed and encouraged, as a part of hypertension management. The importance of attaining a healthy weight is also discussed.     01/19/2024    3:39 PM 12/22/2023    8:43 AM 08/19/2023    1:54 PM 08/05/2023    2:34 PM 05/31/2023    5:19 PM 05/31/2023    5:17 PM 04/07/2023    4:15 PM  BP/Weight  Systolic BP 127 126 132 141 135  178  Diastolic BP 80 78 78 86 93  118  Wt. (Lbs) 213.12 219.12 224.8 229  223   BMI 30.58 kg/m2 31.44 kg/m2 32.26 kg/m2 32.86 kg/m2  32 kg/m2

## 2024-01-19 NOTE — Assessment & Plan Note (Signed)
 Encoraged to ensure adequate water intake minimum 64 ounces daily

## 2024-01-19 NOTE — Assessment & Plan Note (Signed)
 Check testosterone  level

## 2024-01-19 NOTE — Progress Notes (Signed)
 Evan Black     MRN: [id number]      DOB: 1971/01/07  Chief Complaint  Patient presents with   Follow-up    Is here to follow up on dizziness and other ongoing problems. Dizziness has resolved. Wife has more concerns but is not here yet     HPI Evan Black is here with approximate 6 week h/o light headedness, and breaking out in sweat, thinks this happens mainly when he is hungry. Appetite is curbed with phentermine  and he is losing weight  at a [employer] eats sporadically reports drinking sufficient water Excess sweating at night for past 5 months Rduced exercise tolerance with fatigue is noticed in past 3 to 4 months  ROS Denies recent fever or chills. Denies sinus pressure, nasal congestion, ear pain or sore throat. Denies chest congestion, productive cough or wheezing. Denies chest pains, palpitations and leg swelling Denies abdominal pain, nausea, vomiting,diarrhea or constipation.   Denies dysuria, frequency, hesitancy or incontinence. Denies uncontrolled  joint pain, swelling and limitation in mobility. Denies headaches, seizures, numbness, or tingling. Denies depression, anxiety or insomnia. Denies skin break down or rash.   PE  BP 127/80   Pulse 87   Resp 16   Ht 5' 10 (1.778 m)   Wt 213 lb 1.9 oz (96.7 kg)   SpO2 97%   BMI 30.58 kg/m   Patient alert and oriented and in no cardiopulmonary distress.  HEENT: No facial asymmetry, EOMI,     Neck supple .  Chest: Clear to auscultation bilaterally.  CVS: S1, S2 no murmurs, no S3.Regular rate.  ABD: Soft non tender.   Ext: No edema  MS: Adequate ROM spine, shoulders, hips and knees.  Skin: Intact, no ulcerations or rash noted.  Psych: Good eye contact, normal affect. Memory intact not anxious or depressed appearing.  CNS: CN 2-12 intact, power,  normal throughout.no focal deficits noted.   Assessment & Plan  Essential hypertension Controlled, no change in medication DASH diet and commitment  to daily physical activity for a minimum of 30 minutes discussed and encouraged, as a part of hypertension management. The importance of attaining a healthy weight is also discussed.     01/19/2024    3:39 PM 12/22/2023    8:43 AM 08/19/2023    1:54 PM 08/05/2023    2:34 PM 05/31/2023    5:19 PM 05/31/2023    5:17 PM 04/07/2023    4:15 PM  BP/Weight  Systolic BP 127 126 132 141 135  178  Diastolic BP 80 78 78 86 93  118  Wt. (Lbs) 213.12 219.12 224.8 229  223   BMI 30.58 kg/m2 31.44 kg/m2 32.26 kg/m2 32.86 kg/m2  32 kg/m2        Obesity (BMI 30.0-34.9)  Patient re-educated about  the importance of commitment to a  minimum of 150 minutes of exercise per week as able.  The importance of healthy food choices with portion control discussed, as well as eating regularly and within a 12 hour window most days. The need to choose clean , green food 50 to 75% of the time is discussed, as well as to make water the primary drink and set a goal of 64 ounces water daily.       01/19/2024    3:39 PM 12/22/2023    8:43 AM 08/19/2023    1:54 PM  Weight /BMI  Weight 213 lb 1.9 oz 219 lb 1.9 oz 224 lb 12.8 oz  Height 5' 10 (  1.778 m) 5' 10 (1.778 m) 5' 10 (1.778 m)  BMI 30.58 kg/m2 31.44 kg/m2 32.26 kg/m2    Good weight loss , needs top continue this  Back pain Repoprts currently controlled  Fatigue Reduced exercise tolerance and episodes of daphoresis associated with light headedness EKG: NSR, no ischemia, no lVH NORMAL  Light-headed feeling Encoraged to ensure adequate water intake minimum 64 ounces daily  Prediabetes Patient educated about the importance of limiting  Carbohydrate intake , the need to commit to daily physical activity for a minimum of 30 minutes , and to commit weight loss. The fact that changes in all these areas will reduce or eliminate all together the development of diabetes is stressed.      Latest Ref Rng & Units 12/22/2023    9:21 AM 08/04/2023    8:39  AM 10/15/2022   11:17 AM 05/08/2022   10:07 AM 10/09/2021    4:19 PM  Diabetic Labs  HbA1c 4.8 - 5.6 %  5.8  6.4  6.2  6.0   Chol 100 - 199 mg/dL 843  850  858  831  844   HDL >39 mg/dL 31  28  38  32  37   Calc LDL 0 - 99 mg/dL 89  74  83  895  84   Triglycerides 0 - 149 mg/dL 786  707  890  815  801   Creatinine 0.76 - 1.27 mg/dL 8.85  9.09  9.14  8.98  0.79       01/19/2024    3:39 PM 12/22/2023    8:43 AM 08/19/2023    1:54 PM 08/05/2023    2:34 PM 05/31/2023    5:19 PM 05/31/2023    5:17 PM 04/07/2023    4:15 PM  BP/Weight  Systolic BP 127 126 132 141 135  178  Diastolic BP 80 78 78 86 93  118  Wt. (Lbs) 213.12 219.12 224.8 229  223   BMI 30.58 kg/m2 31.44 kg/m2 32.26 kg/m2 32.86 kg/m2  32 kg/m2        No data to display          Updated lab needed at/ before next visit.   Unexplained night sweats Check testosterone   level

## 2024-01-19 NOTE — Assessment & Plan Note (Signed)
  Patient re-educated about  the importance of commitment to a  minimum of 150 minutes of exercise per week as able.  The importance of healthy food choices with portion control discussed, as well as eating regularly and within a 12 hour window most days. The need to choose clean , green food 50 to 75% of the time is discussed, as well as to make water the primary drink and set a goal of 64 ounces water daily.       01/19/2024    3:39 PM 12/22/2023    8:43 AM 08/19/2023    1:54 PM  Weight /BMI  Weight 213 lb 1.9 oz 219 lb 1.9 oz 224 lb 12.8 oz  Height 5' 10 (1.778 m) 5' 10 (1.778 m) 5' 10 (1.778 m)  BMI 30.58 kg/m2 31.44 kg/m2 32.26 kg/m2    Good weight loss , needs top continue this

## 2024-01-20 ENCOUNTER — Ambulatory Visit: Payer: Self-pay | Admitting: Family Medicine

## 2024-01-23 LAB — HEMOGLOBIN A1C
Est. average glucose Bld gHb Est-mCnc: 134 mg/dL
Hgb A1c MFr Bld: 6.3 % — ABNORMAL HIGH (ref 4.8–5.6)

## 2024-01-23 LAB — TESTOSTERONE,FREE AND TOTAL
Testosterone, Free: 2.3 pg/mL — ABNORMAL LOW (ref 7.2–24.0)
Testosterone: 310 ng/dL (ref 264–916)

## 2024-02-19 ENCOUNTER — Other Ambulatory Visit (HOSPITAL_COMMUNITY)

## 2024-03-23 ENCOUNTER — Encounter (INDEPENDENT_AMBULATORY_CARE_PROVIDER_SITE_OTHER): Payer: Self-pay | Admitting: Gastroenterology

## 2024-04-13 ENCOUNTER — Telehealth: Payer: Self-pay

## 2024-04-13 NOTE — Telephone Encounter (Signed)
Form placed in provider box.

## 2024-04-13 NOTE — Telephone Encounter (Signed)
 Copied from CRM [id number]. Topic: General - Other >> Apr 13, 2024  9:21 AM Tobias CROME wrote: Reason for CRM: Patient is needing a handicapped sticker. Patient's wife requesting prescription for handicapped placard. Patient attempted to get it from orthopedic provider but was informed PCP would have to provide it.   Best callback number: [phone]

## 2024-04-27 ENCOUNTER — Encounter: Admitting: Family Medicine

## 2024-06-05 ENCOUNTER — Other Ambulatory Visit: Payer: Self-pay | Admitting: Family Medicine

## 2024-06-23 ENCOUNTER — Ambulatory Visit (INDEPENDENT_AMBULATORY_CARE_PROVIDER_SITE_OTHER)

## 2024-06-23 DIAGNOSIS — Z23 Encounter for immunization: Secondary | ICD-10-CM | POA: Diagnosis not present

## 2024-06-23 NOTE — Progress Notes (Signed)
 Patient is in office today for a nurse visit for Immunization. Patient Injection was given in the  Left deltoid. Patient tolerated injection well.

## 2024-07-12 ENCOUNTER — Other Ambulatory Visit: Payer: Self-pay | Admitting: Family Medicine

## 2024-08-03 ENCOUNTER — Encounter: Admitting: Family Medicine
